# Patient Record
Sex: Male | Born: 1935 | Race: White | Hispanic: No | Marital: Married | Smoking: Former smoker
Health system: Southern US, Community
[De-identification: ages and names within clinical notes are randomized; demographics above are authoritative.]

## PROBLEM LIST (undated history)

## (undated) DIAGNOSIS — I219 Acute myocardial infarction, unspecified: Secondary | ICD-10-CM

## (undated) DIAGNOSIS — E119 Type 2 diabetes mellitus without complications: Secondary | ICD-10-CM

## (undated) DIAGNOSIS — F419 Anxiety disorder, unspecified: Secondary | ICD-10-CM

## (undated) DIAGNOSIS — F329 Major depressive disorder, single episode, unspecified: Secondary | ICD-10-CM

## (undated) DIAGNOSIS — F32A Depression, unspecified: Secondary | ICD-10-CM

## (undated) DIAGNOSIS — I10 Essential (primary) hypertension: Secondary | ICD-10-CM

## (undated) DIAGNOSIS — E785 Hyperlipidemia, unspecified: Secondary | ICD-10-CM

## (undated) DIAGNOSIS — M1990 Unspecified osteoarthritis, unspecified site: Secondary | ICD-10-CM

## (undated) DIAGNOSIS — R5383 Other fatigue: Secondary | ICD-10-CM

## (undated) DIAGNOSIS — IMO0001 Reserved for inherently not codable concepts without codable children: Secondary | ICD-10-CM

## (undated) DIAGNOSIS — D696 Thrombocytopenia, unspecified: Secondary | ICD-10-CM

## (undated) DIAGNOSIS — R42 Dizziness and giddiness: Secondary | ICD-10-CM

## (undated) DIAGNOSIS — M25569 Pain in unspecified knee: Secondary | ICD-10-CM

## (undated) DIAGNOSIS — R51 Headache: Secondary | ICD-10-CM

## (undated) DIAGNOSIS — G4730 Sleep apnea, unspecified: Secondary | ICD-10-CM

## (undated) HISTORY — DX: Thrombocytopenia, unspecified: D69.6

## (undated) HISTORY — PX: EYE SURGERY: SHX253

## (undated) HISTORY — DX: Hyperlipidemia, unspecified: E78.5

## (undated) HISTORY — PX: CARDIAC CATHETERIZATION: SHX172

## (undated) HISTORY — DX: Other fatigue: R53.83

## (undated) HISTORY — DX: Acute myocardial infarction, unspecified: I21.9

## (undated) HISTORY — DX: Pain in unspecified knee: M25.569

## (undated) HISTORY — DX: Type 2 diabetes mellitus without complications: E11.9

## (undated) HISTORY — DX: Dizziness and giddiness: R42

## (undated) HISTORY — DX: Essential (primary) hypertension: I10

---

## 2002-03-07 ENCOUNTER — Ambulatory Visit (HOSPITAL_COMMUNITY): Admission: EM | Admit: 2002-03-07 | Discharge: 2002-03-07 | Payer: Self-pay | Admitting: Emergency Medicine

## 2002-03-07 ENCOUNTER — Encounter: Payer: Self-pay | Admitting: Urology

## 2002-03-07 ENCOUNTER — Encounter: Payer: Self-pay | Admitting: Emergency Medicine

## 2002-03-16 ENCOUNTER — Encounter: Payer: Self-pay | Admitting: Urology

## 2002-03-16 ENCOUNTER — Ambulatory Visit (HOSPITAL_BASED_OUTPATIENT_CLINIC_OR_DEPARTMENT_OTHER): Admission: RE | Admit: 2002-03-16 | Discharge: 2002-03-16 | Payer: Self-pay | Admitting: Urology

## 2002-05-14 ENCOUNTER — Ambulatory Visit (HOSPITAL_COMMUNITY): Admission: RE | Admit: 2002-05-14 | Discharge: 2002-05-14 | Payer: Self-pay | Admitting: Gastroenterology

## 2002-05-14 ENCOUNTER — Encounter (INDEPENDENT_AMBULATORY_CARE_PROVIDER_SITE_OTHER): Payer: Self-pay | Admitting: *Deleted

## 2002-06-03 ENCOUNTER — Encounter: Payer: Self-pay | Admitting: Interventional Cardiology

## 2002-06-03 ENCOUNTER — Inpatient Hospital Stay (HOSPITAL_COMMUNITY): Admission: EM | Admit: 2002-06-03 | Discharge: 2002-06-09 | Payer: Self-pay | Admitting: Emergency Medicine

## 2002-06-04 ENCOUNTER — Encounter: Payer: Self-pay | Admitting: Thoracic Surgery (Cardiothoracic Vascular Surgery)

## 2002-06-05 ENCOUNTER — Encounter: Payer: Self-pay | Admitting: Thoracic Surgery (Cardiothoracic Vascular Surgery)

## 2002-06-06 ENCOUNTER — Encounter: Payer: Self-pay | Admitting: Thoracic Surgery (Cardiothoracic Vascular Surgery)

## 2002-06-07 ENCOUNTER — Encounter: Payer: Self-pay | Admitting: Surgery

## 2002-06-27 ENCOUNTER — Inpatient Hospital Stay (HOSPITAL_COMMUNITY): Admission: EM | Admit: 2002-06-27 | Discharge: 2002-07-02 | Payer: Self-pay | Admitting: Emergency Medicine

## 2003-07-03 DIAGNOSIS — I219 Acute myocardial infarction, unspecified: Secondary | ICD-10-CM

## 2003-07-03 HISTORY — PX: CORONARY ARTERY BYPASS GRAFT: SHX141

## 2003-07-03 HISTORY — DX: Acute myocardial infarction, unspecified: I21.9

## 2005-03-27 ENCOUNTER — Encounter: Admission: RE | Admit: 2005-03-27 | Discharge: 2005-03-27 | Payer: Self-pay | Admitting: Urology

## 2005-03-29 ENCOUNTER — Ambulatory Visit (HOSPITAL_COMMUNITY): Admission: RE | Admit: 2005-03-29 | Discharge: 2005-03-29 | Payer: Self-pay | Admitting: Urology

## 2005-03-29 ENCOUNTER — Ambulatory Visit (HOSPITAL_BASED_OUTPATIENT_CLINIC_OR_DEPARTMENT_OTHER): Admission: RE | Admit: 2005-03-29 | Discharge: 2005-03-29 | Payer: Self-pay | Admitting: Urology

## 2005-04-05 ENCOUNTER — Ambulatory Visit (HOSPITAL_COMMUNITY): Admission: RE | Admit: 2005-04-05 | Discharge: 2005-04-05 | Payer: Self-pay | Admitting: Urology

## 2007-02-09 ENCOUNTER — Ambulatory Visit (HOSPITAL_COMMUNITY): Admission: RE | Admit: 2007-02-09 | Discharge: 2007-02-09 | Payer: Self-pay | Admitting: Family Medicine

## 2007-02-09 ENCOUNTER — Encounter (INDEPENDENT_AMBULATORY_CARE_PROVIDER_SITE_OTHER): Payer: Self-pay | Admitting: Family Medicine

## 2007-02-27 ENCOUNTER — Inpatient Hospital Stay (HOSPITAL_COMMUNITY): Admission: EM | Admit: 2007-02-27 | Discharge: 2007-03-03 | Payer: Self-pay | Admitting: Emergency Medicine

## 2008-02-20 ENCOUNTER — Encounter: Admission: RE | Admit: 2008-02-20 | Discharge: 2008-02-20 | Payer: Self-pay | Admitting: Family Medicine

## 2008-02-20 ENCOUNTER — Encounter: Payer: Self-pay | Admitting: Pulmonary Disease

## 2008-08-30 ENCOUNTER — Encounter: Admission: RE | Admit: 2008-08-30 | Discharge: 2008-08-30 | Payer: Self-pay | Admitting: Family Medicine

## 2008-08-30 ENCOUNTER — Encounter: Payer: Self-pay | Admitting: Pulmonary Disease

## 2008-09-27 ENCOUNTER — Ambulatory Visit: Payer: Self-pay | Admitting: Pulmonary Disease

## 2008-09-27 DIAGNOSIS — E119 Type 2 diabetes mellitus without complications: Secondary | ICD-10-CM

## 2008-09-27 DIAGNOSIS — I10 Essential (primary) hypertension: Secondary | ICD-10-CM

## 2008-09-27 DIAGNOSIS — Z91041 Radiographic dye allergy status: Secondary | ICD-10-CM

## 2008-09-27 DIAGNOSIS — R519 Headache, unspecified: Secondary | ICD-10-CM | POA: Insufficient documentation

## 2008-09-27 DIAGNOSIS — J309 Allergic rhinitis, unspecified: Secondary | ICD-10-CM | POA: Insufficient documentation

## 2008-09-27 DIAGNOSIS — J984 Other disorders of lung: Secondary | ICD-10-CM | POA: Insufficient documentation

## 2008-09-27 DIAGNOSIS — I219 Acute myocardial infarction, unspecified: Secondary | ICD-10-CM | POA: Insufficient documentation

## 2008-09-27 DIAGNOSIS — E785 Hyperlipidemia, unspecified: Secondary | ICD-10-CM

## 2008-09-27 DIAGNOSIS — R51 Headache: Secondary | ICD-10-CM

## 2009-03-16 ENCOUNTER — Encounter: Payer: Self-pay | Admitting: Pulmonary Disease

## 2009-03-25 ENCOUNTER — Ambulatory Visit: Payer: Self-pay | Admitting: Internal Medicine

## 2009-09-01 ENCOUNTER — Ambulatory Visit: Payer: Self-pay | Admitting: Pulmonary Disease

## 2010-01-26 ENCOUNTER — Telehealth (INDEPENDENT_AMBULATORY_CARE_PROVIDER_SITE_OTHER): Payer: Self-pay | Admitting: *Deleted

## 2010-02-27 ENCOUNTER — Encounter: Admission: RE | Admit: 2010-02-27 | Discharge: 2010-02-27 | Payer: Self-pay | Admitting: Pulmonary Disease

## 2010-03-02 ENCOUNTER — Ambulatory Visit: Payer: Self-pay | Admitting: Pulmonary Disease

## 2010-07-26 ENCOUNTER — Ambulatory Visit: Payer: Self-pay | Admitting: Hematology & Oncology

## 2010-08-01 NOTE — Assessment & Plan Note (Signed)
Summary: rov for pulmonary nodule.   Copy to:  Henrine Screws Primary Provider/Referring Provider:  Deboraha Sprang at [facility]  CC:  Pt is here for a f/u appt to discuss CT results.   Pt states he notices sob when he "bends over and stands back up."  States it feels like a "black out."  Pt states he has a non-productive cough but will also occ cough up white sputum. [name]  History of Present Illness: The pt comes in today for f/u of his known abnormal ct chest.  He has a h/o GGO in RLL that has been followed, and his 2 year anniversary chest ct that was just done shows no change.  He denies any significant symptoms such as weight loss, chest pain, hemooptysis.  He has a minimal cough that is not overly bothersome.  Current Medications (verified): 1)  Metformin Hcl 500 Mg Tabs (Metformin Hcl) .... Take 1 Tablet By Mouth Two Times A Day 2)  Zocor 40 Mg Tabs (Simvastatin) .... Take 1 Tablet By Mouth Once A Day 3)  Carvedilol 12.5 Mg Tabs (Carvedilol) .... Take 1 Tablet By Mouth Two Times A Day 4)  Finasteride 5 Mg Tabs (Finasteride) .... Take 1 Tablet By Mouth Once A Day 5)  Sertraline Hcl 50 Mg Tabs (Sertraline Hcl) .... Take 1 Tablet By Mouth Once A Day 6)  Bayer Aspirin 325 Mg Tabs (Aspirin) .... Take 1 Tablet By Mouth Once A Day 7)  Tylenol Pm Extra Strength 500-25 Mg Tabs (Diphenhydramine-Apap (Sleep)) .... As Needed 8)  Tylenol Extra Strength 500 Mg Tabs (Acetaminophen) .... As Needed 9)  Zyrtec Allergy 10 Mg Tabs (Cetirizine Hcl) .... Take 1 Tablet By Mouth Once A Day As Needed  Allergies (verified): 1)  * Contrast Dye  Review of Systems       The patient complains of shortness of breath with activity, productive cough, non-productive cough, acid heartburn, indigestion, sore throat, headaches, nasal congestion/difficulty breathing through nose, and sneezing.  The patient denies shortness of breath at rest, coughing up blood, chest pain, irregular heartbeats, loss of appetite, weight change,  abdominal pain, difficulty swallowing, tooth/dental problems, itching, ear ache, anxiety, depression, hand/feet swelling, joint stiffness or pain, rash, change in color of mucus, and fever.    Vital Signs:  Patient profile:   77 year old male Height:      76 inches Weight:      280.25 pounds BMI:     34.24 O2 Sat:      96 % on Room air Temp:     98.3 degrees F oral Pulse rate:   66 / minute BP sitting:   120 / 70  (left arm) Cuff size:   large  Vitals Entered By: Arman Filter LPN (March 02, 2010 10:21 AM)  O2 Flow:  Room air CC: Pt is here for a f/u appt to discuss CT results.   Pt states he notices sob when he "bends over and stands back up."  States it feels like a "black out."  Pt states he has a non-productive cough but will also occ cough up white sputum.  Comments Medications reviewed with patient Arman Filter LPN  March 02, 2010 10:21 AM    Physical Exam  General:  obese male in nad Lungs:  clear to auscultation Heart:  rrr Extremities:  no significant edema noted, no cyanosis Neurologic:  alert and oriented, moves all 4.   Impression & Recommendations:  Problem # 1:  PULMONARY NODULE, RIGHT  LOWER LOBE (ICD-518.89)  the abnormal ct has been without change for 2 years.  This satisfies the criteria for the definition of benign.  However, his densities in question were ground glass in nature, and there has been some recommendations to follow these types of densities longer.  I have recommended to the pt to have a cxr in one year, or sooner if new symptoms develop.  Medications Added to Medication List This Visit: 1)  Finasteride 5 Mg Tabs (Finasteride) .... Take 1 tablet by mouth once a day 2)  Zyrtec Allergy 10 Mg Tabs (Cetirizine hcl) .... Take 1 tablet by mouth once a day as needed  Other Orders: Est. Patient Level II (16109)  Patient Instructions: 1)  your "spots" have been stable over 2 years, and therefore no further ct surveillance required.  I  would recommend a standard cxr in one year with your primary md.  Will send a note to them.

## 2010-08-01 NOTE — Assessment & Plan Note (Signed)
Summary: rov for pulmonary nodule   Primary Provider/Referring Provider:  Deboraha Sprang at [facility]  CC:  Pt is here for a 33yr f/u appt.  Pt states breathing is unchanged from last visit.  Pt states coughing up white sputum but believes this is d/t allergies. Pt denied any new complaints.  .  History of Present Illness: The pt comes in today for f/u of his abnormal ct chest.  He has a GGO in his RLL that is being followed.  His last ct was in Sept of last year, and he is due for one last f/u in sept of this year.  He has only a mild cough that primarily occurs at night on lying down, and he feels is due to postnasal drip.  His appetite is adequate, and he is not losing weight.  He denies any hemoptysis  Medications Prior to Update: 1)  Metformin Hcl 500 Mg Tabs (Metformin Hcl) .... Take 1 Tablet By Mouth Two Times A Day 2)  Zocor 40 Mg Tabs (Simvastatin) .... Take 1 Tablet By Mouth Once A Day 3)  Niaspan 1000 Mg Cr-Tabs (Niacin (Antihyperlipidemic)) .... Take 1 Tablet By Mouth Two Times A Day 4)  Carvedilol 12.5 Mg Tabs (Carvedilol) .... Take 1 Tablet By Mouth Two Times A Day 5)  Avodart 0.5 Mg Caps (Dutasteride) .... Take 1 Tablet By Mouth Once A Day 6)  Cimetidine 400 Mg Tabs (Cimetidine) .... Take 1 Tablet By Mouth Once A Day 7)  Sertraline Hcl 50 Mg Tabs (Sertraline Hcl) .... Take 1 Tablet By Mouth Once A Day 8)  Bayer Aspirin 325 Mg Tabs (Aspirin) .... Take 1 Tablet By Mouth Once A Day 9)  Tylenol Pm Extra Strength 500-25 Mg Tabs (Diphenhydramine-Apap (Sleep)) .... As Needed 10)  Tylenol Extra Strength 500 Mg Tabs (Acetaminophen) .... As Needed 11)  Zyrtec Allergy 10 Mg Tabs (Cetirizine Hcl) .... Take 1 Tablet By Mouth Once A Day  Allergies (verified): 1)  * Contrast Dye  Review of Systems      See HPI  Vital Signs:  Patient profile:   77 year old male Height:      76 inches Weight:      283 pounds BMI:     34.57 O2 Sat:      95 % on Room air Temp:     97.6 degrees F  oral Pulse rate:   70 / minute BP sitting:   120 / 70  (left arm) Cuff size:   large  Vitals Entered By: Arman Filter LPN (September 01, 452 10:11 AM)  O2 Flow:  Room air CC: Pt is here for a 63yr f/u appt.  Pt states breathing is unchanged from last visit.  Pt states coughing up white sputum but believes this is d/t allergies. Pt denied any new complaints.   Comments Medications reviewed with patient Arman Filter LPN  September 02, 979 10:11 AM    Physical Exam  General:  ow male in nad Lungs:  fairly clear to auscultation Heart:  rrr   Impression & Recommendations:  Problem # 1:  PULMONARY NODULE, RIGHT LOWER LOBE (ICD-518.89)  The pt has a ground glass opacity in RLL that is being followed, and has not changed in 18mos.  He is due for one more f/u in Sept of this year.  Other Orders: Est. Patient Level II (19147)  Patient Instructions: 1)  will schedule for last scan of chest in september of this year.  Please call us  in august to schedule.  I will also put a reminder for me in the computer.   Immunization History:  Influenza Immunization History:    Influenza:  historical (08/02/2009)

## 2010-08-01 NOTE — Progress Notes (Signed)
Summary: ct scan  Phone Note Call from Patient   Caller: Spouse nancy Call For: clance Summary of Call: pt need to sch ct scan and would like to have it at  [facility] [place] on [address].unable to come on tues. Initial call taken by: Rickard Patience,  January 26, 2010 9:16 AM  Follow-up for Phone Call        Per last OV from 08/2009, pt needs to have CT chest Sept of this year.   Spoke with pt's wife, Harriett Sine.  Per Harriett Sine, pt has OV with [facility] on Sept 1 -- requesting to have CT in August before seeing [facility].  Would also like to have the CT at [facility] in [place].    Dr. Shelle Iron, pls Francee Piccolo if this is ok.  Thanks! Follow-up by: Gweneth Dimitri RN,  January 26, 2010 9:40 AM  Additional Follow-up for Phone Call Additional follow up Details #1::        ok to have noncontrasted ct chest the end of august.  ok to have in [place] ONLY IF SHOWS UP IN [facility] in computer. Additional Follow-up by: Barbaraann Share MD,  January 26, 2010 4:59 PM    Additional Follow-up for Phone Call Additional follow up Details #2::    Order placed for ct chest non contrast to be done at [facility] in [place].   Follow-up by: Vernie Murders,  January 26, 2010 5:07 PM

## 2010-08-17 ENCOUNTER — Ambulatory Visit (HOSPITAL_BASED_OUTPATIENT_CLINIC_OR_DEPARTMENT_OTHER): Payer: Medicare Other | Admitting: Hematology & Oncology

## 2010-08-17 ENCOUNTER — Other Ambulatory Visit: Payer: Self-pay | Admitting: Hematology & Oncology

## 2010-08-17 DIAGNOSIS — D6949 Other primary thrombocytopenia: Secondary | ICD-10-CM

## 2010-08-17 DIAGNOSIS — E119 Type 2 diabetes mellitus without complications: Secondary | ICD-10-CM

## 2010-08-17 DIAGNOSIS — I10 Essential (primary) hypertension: Secondary | ICD-10-CM

## 2010-08-17 DIAGNOSIS — Z79899 Other long term (current) drug therapy: Secondary | ICD-10-CM

## 2010-08-17 LAB — CBC WITH DIFFERENTIAL (CANCER CENTER ONLY)
BASO%: 0.9 % (ref 0.0–2.0)
EOS%: 3.1 % (ref 0.0–7.0)
LYMPH%: 27.3 % (ref 14.0–48.0)
MCH: 28.5 pg (ref 28.0–33.4)
MCHC: 33.2 g/dL (ref 32.0–35.9)
MCV: 86 fL (ref 82–98)
MONO%: 9.9 % (ref 0.0–13.0)
NEUT#: 2.9 10*3/uL (ref 1.5–6.5)
Platelets: 106 10*3/uL — ABNORMAL LOW (ref 145–400)
RDW: 13 % (ref 10.5–14.6)

## 2010-08-17 LAB — MORPHOLOGY - CHCC SATELLITE

## 2010-08-21 LAB — ANA: Anti Nuclear Antibody(ANA): NEGATIVE

## 2010-08-21 LAB — LACTATE DEHYDROGENASE: LDH: 181 U/L (ref 94–250)

## 2010-08-21 LAB — PROTEIN ELECTROPHORESIS, SERUM
Alpha-1-Globulin: 4.3 % (ref 2.9–4.9)
Alpha-2-Globulin: 9 % (ref 7.1–11.8)
Beta 2: 4.6 % (ref 3.2–6.5)
Beta Globulin: 6.7 % (ref 4.7–7.2)
Gamma Globulin: 13.4 % (ref 11.1–18.8)

## 2010-11-14 NOTE — H&P (Signed)
NAME:  Jacob [name], Jacob [name]                ACCOUNT NO.:  [id number]   MEDICAL RECORD NO.:  [id number]          PATIENT TYPE:  INP   LOCATION:  1323                         FACILITY:  [facility]   PHYSICIAN:  Kela Millin, M.D.DATE OF BIRTH:  [birth year]   DATE OF ADMISSION:  02/27/2007  DATE OF DISCHARGE:                              HISTORY & PHYSICAL   PRIMARY CARE PHYSICIAN:  Sigmund Hazel, M.D.   CHIEF COMPLAINT:  Right knee area of swelling and drainage.   HISTORY OF PRESENT ILLNESS:  The patient is a 77 year old white male  with past medical history significant for recently diagnosed diabetes  mellitus, coronary artery disease with history of MI and status post  CABG x4 in 2003, hypertension, hyperlipidemia, GERD, panic disorder and  obesity who presents with above complaints.  He states that earlier in  August he was kneeling down and scrubbing the bathroom floor with his  knees padded when he felt some pain in his right knee.  He states he did  not think too much about it, but the next day it began swelling and a  few days later he went to the walk-in clinic on February 09, 2007.  He  reports that he was sent to have an ultrasound of his lower extremities  done and he was told that this was negative/within normal limits, and  that he was started on oral steroids for probable bursitis.  He reports  that there was no improvement in his symptoms with the steroids, and so  he was subsequently started on antibiotic, Keflex per his primary care  physician, which he took for seven days (August 19-25).  Even following  that, he continued to have the swelling with pain and redness in that  area.  So, he was given another course of antibiotics, doxycycline which  he had been on since August 26.  On the day prior to admission, the  patient reports that he began having a drainage from those areas on his  leg and so he went to see his primary care physician and was directly  admitted for further  evaluation and management.  The patient denies any  insect bites.  He also denies fevers, dysuria, cough, diarrhea, melena  and no hematochezia.   PAST MEDICAL HISTORY:  1. As stated above.  2. History of ureteral calculus and status post stents.  3. GERD.   MEDICATIONS:  1. Metformin 500 mg p.o. b.i.d.  2. Zocor.  3. Zoloft 50 mg daily.  4. Niacin.  5. Simethicone 400 mg daily.  6. Avodart 0.5 mg daily.  7. Carvedilol 6.25 mg p.o. b.i.d.   ALLERGIES:  IVP DYE, IODINE CONTRAST.   SOCIAL HISTORY:  He quit tobacco 30-40 years ago, occasional alcohol.   FAMILY HISTORY:  Noncontributory.   REVIEW OF SYSTEMS:  As per HPI, other review of systems negative .   PHYSICAL EXAMINATION:  GENERAL:  The patient is an elderly white male,  in no apparent distress.  VITAL SIGNS:  Temperature 97.2, blood pressure 121/73, pulse 86,  respirations 18, O2 saturation 96%.  HEENT:  PERRL.  EOMI.  Sclerae anicteric.  Mucous membranes moist.  LUNGS:  Clear to auscultation bilaterally.  No crackles or wheezes.  CARDIOVASCULAR:  Regular rate and rhythm.  Normal S1, S2.  ABDOMEN:  Soft, bowel sounds present.  Nontender, nondistended.  No  organomegaly.  No masses palpable.  EXTREMITIES:  He has erythema around his right knee and it is edematous  (prepatellar).  Just inferiorly and medially to the knee joint, there is  a fluctuant area, and also more laterally, there is an area of  developing fluctuance as well.  He has serosanguineous dried up drainage  from the inferomedial area.  NEUROLOGICAL:  He is alert and oriented x3.  Cranial nerves II-XII  grossly intact.  Nonfocal exam.   LABORATORY DATA:  White cell count 5.5, hemoglobin 12.9, hematocrit  37.8, platelets 113,000, 60% neutrophils.  Sodium 139, potassium 4.3,  chloride 108, CO2 26, glucose 117, BUN 17, creatinine 1.02.  His AST is  43 with an ALT of 58, alk-phos 66, total bilirubin is 0.8.  His EKG  shows normal sinus rhythm, no ischemic  changes.   ASSESSMENT/PLAN:  1. Right leg/knee cellulitis with probable abscess - will obtain      cultures.  Also, MRI of the knee.  Empiric antibiotics.  Follow in      consult of Orthopedics accordingly for further recommendations      pending above studies.  2. Diabetes mellitus - obtain a hemoglobin A1C, tight blood glucose      control, sliding scale coverage and continue outpatient medications      follow.  3. Coronary artery disease - continue Carvedilol, patient chest pain      free, EKG as above.  4. GERD - place on PPI and follow.  5. Hyperlipidemia.  Continue outpatient medications.      Kela Millin, M.D.  Electronically Signed     ACV/MEDQ  D:  02/28/2007  T:  02/28/2007  Job:  [id number]   cc:   Sigmund Hazel, M.D.  Fax: [phone]

## 2010-11-14 NOTE — Discharge Summary (Signed)
[name], [name]                ACCOUNT NO.:  [id number]   MEDICAL RECORD NO.:  [id number]          PATIENT TYPE:  INP   LOCATION:  1323                         FACILITY:  [facility]   PHYSICIAN:  Kela Millin, M.D.DATE OF BIRTH:  08/04/1932   DATE OF ADMISSION:  02/27/2007  DATE OF DISCHARGE:  03/03/2007                               DISCHARGE SUMMARY   DISCHARGE DIAGNOSES:  1. Methicillin-sensitive Staphylococcus aureus bursa infection, right      knee, with cellulitis - status post incision and drainage  per      orthopedics.  2. Diabetes mellitus.  3. Coronary artery disease.  4. Hypertension.  5. Gastroesophageal reflux disease.  6. Hyperlipidemia.  7. History of panic disorder.  8. History of ureteral calculus - status post stents.   PROCEDURES AND STUDIES:  1. MRI of the right knee - cellulitis and prepatellar bursitis.  The      patient's prepatellar fluid may be infected.  The fluid collection      extends inferiorly into the pretibial space, with the collection      overall measuring approximately 10 cm.  Studies negative for      osteomyelitis.  Cellulitis and pretibial/prepatellar bursitis.  2. I&D of abscess performed per orthopedics on 03/01/2007.   CONSULTATIONS:  Orthopedics, Dr. Luiz [name].   HISTORY OF PRESENT ILLNESS:  The patient is a 77 year old white male  with the above-listed medical problems who presented with swelling and a  drainage from his right knee area.  He stated that this began earlier in  01/2007 after he had scrubbed the floor while kneeling down.  He was  treated outpatient with steroids initially, and then with antibiotics -  initially Keflex and then with doxycycline just prior to his admission.  Because the swelling was increasing and he began having some drainage,  he was admitted for further evaluation and management.  Please see the  full admission history and physical of 02/27/2007 for the admission  physical exam, as well as  laboratory data.   HOSPITAL COURSE:  1. Methicillin-sensitive Staphylococcus aureus bursa infection, right      knee, with cellulitis in leg - upon admission the patient had wound      cultures done, as well as blood cultures, and was empirically      placed on broad-spectrum IV antibiotics, including coverage for      MRSA.  The wound cultures grew Staph aureus that was oxacillin-      sensitive.  The blood cultures have remained negative to date.  The      patient had an MRI of his right knee done upon admission, and      orthopedics was consulted as well, and Dr. Luiz [name] saw the patient      and I&D of the infected bursa was done.  The fluid was sent to the      lab and Gram stain was done, which showed no organisms.  Following      the culture results the vancomycin was discontinued, and the      patient has remained afebrile and his  symptoms have continued to      improve.  Orthopedics saw him on rounds today and recommended that      he be discharged on oral antibiotics for two more weeks.  He will      be discharged on Augmentin, and he is to follow up with Dr. Luiz [name]      on Thursday.  Dressing changes as instructed per orthopedics.  The      patient is also to follow up with his primary care physician.  2. Coronary artery disease  - stable.  The patient remained chest pain-      free during his hospital stay.  He was maintained on his outpatient      medications.  3. Hypertension - the patient was maintained on his outpatient      medications during his hospital stay.  4. History of panic disorder - patient was maintained on his      outpatient medications.  5. GERD - the patient was maintained on the PPI while in the hospital.  6. Diabetes mellitus - he is to continue his metformin upon discharge.   DISCHARGE MEDICATIONS:  1. Augmentin 875 mg one p.o. b.i.d. x2 more weeks.  2. The patient is to continue his preadmission medications -      metformin, Zocor, Zoloft, Niacin,  simethicone, Avodart, carvedilol.   FOLLOW-UP CARE:  1. Dr. Luiz [name] on Thursday as scheduled.  2. Dr. Sigmund [name] in one to two weeks.   CONDITION ON DISCHARGE:  Improved/stable.      Kela Millin, M.D.  Electronically Signed     ACV/MEDQ  D:  03/03/2007  T:  03/03/2007  Job:  [id number]   cc:   Jacob [name], M.D.  Fax: [phone]   Jacob [name], M.D.  Fax: [phone]

## 2010-11-17 NOTE — Op Note (Signed)
TNAMEWEI, POPLASKI                         ACCOUNT NO.:  [id number]   MEDICAL RECORD NO.:  [id number]                   PATIENT TYPE:  EMS   LOCATION:  ED                                   FACILITY[facility]  [facility]   PHYSICIAN:  Claudette Laws, M.D.               DATE OF BIRTH:  05/21/1933   DATE OF PROCEDURE:  03/07/2002  DATE OF DISCHARGE:                                 OPERATIVE REPORT   PREOPERATIVE DIAGNOSES:  1. Proximal obstructing right ureteral calculus.  2. Renal colic and hydronephrosis.  3. Past history of nephrolithiasis.   POSTOPERATIVE DIAGNOSE:  1. Proximal obstructing right ureteral calculus.  2. Renal colic and hydronephrosis.  3. Past history of nephrolithiasis.   OPERATION:  1. Cystoscopy.  2. Right retrograde pyeloureterogram.  3. Insertion of a 6 French 26 cm double-J stent.   SURGEON:  Claudette Laws, M.D.   HISTORY:  This is a 77 year old man, who presented in the [facility] ER  earlier this morning with right-sided colicky pain.  Subsequent CAT scan  showed a 6-7 mm obstructing proximal right ureteral calculus with high-grade  obstruction.  The patient has been having symptoms now for several days.  We  discussed treatment options, and I thought for now the best treatment would  be to insert a double-J stent.  The procedure was carefully explained to he  and his wife.  They understand and agrees to the proposed surgery.   DESCRIPTION OF PROCEDURE:  The patient was prepped and draped in the dorsal  lithotomy position under intubated general anesthesia.  Cystoscopy revealed  a normal anterior urethra, a small prostate, some elevation of the posterior  lip, normal bladder, otherwise no tumors, no calculi, normal ureteral  orifices.   Initially, I intubated the ureter with a 6 [place] open-ended ureteral  catheter over an .038 Glidewire.  Retrograde pyelogram studies were  performed.  Pictures were taken.  We the positioned the guidewire in the  upper  pole calix, then put in a #6 French 26 cm double-J stent.  It was  positioned into the renal pelvis.  The distal end was curled up in the  bladder.  The bladder was emptied, and all instruments were removed.   He was then given a B&O suppository per rectum for bladder spasms and also  Xylocaine jelly per urethra 10 cc for anesthetic purposes.   He was then taken back to the recovery room in satisfactory condition.   The plan now is to follow up with [facility] on a timely basis.                                               Claudette Laws, M.D.    RFS/MEDQ  D:  03/07/2002  T:  03/07/2002  Job:  [id number]

## 2010-11-17 NOTE — Cardiovascular Report (Signed)
NAME:  Jacob [name], Jacob [name]                          ACCOUNT NO.:  [id number]   MEDICAL RECORD NO.:  [id number]                   PATIENT TYPE:  INP   LOCATION:  1825                                 FACILITY:  [facility]   PHYSICIAN:  Lesleigh Noe, M.D.            DATE OF BIRTH:  [birth year]   DATE OF PROCEDURE:  06/03/2002  DATE OF DISCHARGE:                              CARDIAC CATHETERIZATION   INDICATIONS FOR PROCEDURE:  Acute inferior myocardial infarction.   PROCEDURE PERFORMED:  1. Left heart catheterization.  2. Selective coronary angiogram.  3. Left ventriculography.  4. Venous sheath insertion.  5. Percutaneous transluminal coronary angioplasty of the posterior     descending artery of the right coronary.   DESCRIPTION OF PROCEDURE:  After informed consent, the patient was brought  from the [facility] Emergency Room where he was diagnosed as having an acute  inferior myocardial infarction.  The patient feels that his symptoms started  at 12:10 p.m. today. In Dr. Fredirick Maudlin office ST segment was noted in the  inferior leads.  In the emergency room this electrocardiographic change  persisted.  He was started on IV heparin being given a 5000 unit IV bolus.  Reportedly, he received 300 mg of Plavix orally by EMS en route, started on  IV nitroglycerin drip, given aspirin, and also morphine for pain.   In the catheterization lab, a 7 French arterial sheath and a 6 French venous  sheath was placed in the right femoral artery and vein respectively.  Coronary angiography was performed with a 6 French A2 multipurpose catheter  to measure hemodynamics, to perform ventriculography by hand injection, and  to perform right coronary angiography.  We then performed left coronary  angiography using a 6 French #4 left Judkins catheter.  The patient had  extensive left coronary disease and a subtotally occluded proximal PDA that  was felt to be the culprit vessel.  We upgraded to a 7 [place]  right Judkins  catheter using a 300 cm long BMW wire, a double bolus followed by infusion  of Integrilin was started and angioplasty to the PA was performed using a  2.5 mm 15 mm long and a 3.0 mm 15 mm long Maverick balloon for  recanalization.  ACT prior to wire intervention was noted to be greater than  300 seconds.  The patient tolerated the procedure without complications. I  decided against stenting the PDA as this could potentially prevent access  for coronary grafting, as the patient's left coronary anatomy seems to  dictate the bypass surgery be considered as a treatment of choice for long-  term survival and control of symptoms.  ACT postprocedure was 336 seconds.   RESULTS:   HEMODYNAMIC DATA:  1. Aortic pressure 103/68.  2. Left ventricular pressure 120/14.   LEFT VENTRICULOGRAPHY:  Inferoapical severe hypokinesis is noted. EF is  greater than 50%.  No MR.   SELECTIVE  CORONARY ANGIOGRAPHY:  1. Left main coronary artery:  The left main is large and trifurcates into     LAD, ramus and circumflex.  No significant left main disease is noted.  2. Left anterior descending coronary:  The left anterior descending is     diffusely diseased with 40-50% proximal and ostial narrowing.  A large     septal perforator arises from the proximal LAD and trifurcates into the     intraventricular septum. The LAD beyond the septal perforator contains an     85% segmental stenosis.  There is ratty disease throughout the LAD beyond     this stenosis. The first diagonal arises just distal to the septal     perforator from the LAD and in its midportion before a bifurcation in the     diagonal there is 60-70% diffuse narrowing.  3. Ramus branch:  The ramus branch is large arising from the left main.     There is ostial 70-80% narrowing.  There is proximal 80-90% ramus branch     narrowing.  The ramus bifurcates and is relatively large.  The proximal     and mid vessel are tortuous.  4.  Circumflex artery:  Circumflex coronary artery bifurcates into two obtuse     marginals.  The first obtuse marginal is large, the second obtuse     marginal is relatively small.  The ostium of the first obtuse marginal     contains an 80% stenosis.  There is 50-70% mid circumflex stenosis.  5. Right coronary:  The right coronary is dominant and it gives origin to a     large PDA which is totally occluded in its proximal segment and also to a     trifurcating left ventricular branch.  There are luminal irregularities     noted throughout the proximal right coronary and mid right coronary but     no high-grade obstruction is felt to be present.   PERCUTANEOUS INTERVENTION:  Percutaneous transluminal coronary angioplasty  of the PDA resulted in relief of  99% obstruction to less than 30% with TIMI  grade 3 flow noted postprocedure.   CONCLUSIONS:  1. Acute inferior infarction due to occlusion of the posterior descending     coronary artery.  2. Successful percutaneous transluminal coronary angioplasty on the     posterior descending artery.  3. High-grade obstruction in the proximal ramus via mid circumflex in the     proximal and mid left anterior descending.  4. Mild left ventricular dysfunction with inferoapical wall motion     abnormality.   PLAN:  Continue aspirin and Integrilin.  Hold Plavix.  Consider for coronary  revascularization early next week assuming no recurrent acute ischemia.  If  recurrent acute ischemia, will need to stent the PDA.                                               Lesleigh Noe, M.D.    HWS/MEDQ  D:  06/03/2002  T:  06/03/2002  Job:  [id number]   cc:   Caryn Bee L. Little, M.D.  [address]  [place]  [place] [address]  Fax: [phone]   CVTS

## 2010-11-17 NOTE — Discharge Summary (Signed)
NAME:  Jacob [name], Jacob [name]                          ACCOUNT NO.:  [id number]   MEDICAL RECORD NO.:  [id number]                   PATIENT TYPE:  INP   LOCATION:  2009                                 FACILITY:  [facility]   PHYSICIAN:  Salvatore Decent. Dorris Fetch, M.D.         DATE OF BIRTH:  29-Apr-1933   DATE OF ADMISSION:  06/03/2002  DATE OF DISCHARGE:  06/09/2002                                 DISCHARGE SUMMARY   CARDIOLOGIST:  Lyn Records, M.D.   PRIMARY CARE PHYSICIAN:  Al Decant. Janey Greaser, M.D.   DISCHARGE DIAGNOSES:  1. Severe three-vessel coronary artery disease.  2. Ejection fraction of 50%.  3. Status post acute inferior myocardial infarction.  4. Hypertension.  5. Dyslipidemia.  6. Diet-controlled hyperglycemia.  7. Obesity.  8. Panic disorder.  9. Gastroesophageal reflux disease.  10.      Postoperative anemia, transfused.  11.      Postoperative volume excess.   PROCEDURE:  1. Cardiac catheterization on June 03, 2002.  2. Coronary artery bypass graft surgery x4 on June 04, 2002, with the     following grafts:  LIMA to the LAD, saphenous vein graft to the first     diagonal, saphenous vein graft to the ramus intermedius, saphenous vein     graft to the posterior descending.   HISTORY OF PRESENT ILLNESS:  The patient is a 77 year old gentleman who  presented with an acute evolving myocardial infarction.  He had been at the  gym exercising, when he developed chest pain.  He was brought to the  hospital and was ruled in for a myocardial infarction.   HOSPITAL COURSE:  He had an emergent cardiac catheterization done, and was  stabilized.  CVTS was consulted and a coronary artery bypass graft surgery  was recommended.  The patient agreed to proceed.  On the following day on  June 04, 2002, the patient underwent the procedure with no complications.  Postoperatively he did well with routine care.  He was extubated on  postoperative day one.  He was doing well and was  deemed suitable for  transfer to unit 2000.  He transferred to 2000 on June 05, 2002.  He did  require a transfusion for postoperative anemia.  This improved over a couple  of days.  He needed a little bit of extra diuresis for volume excess.  On  June 08, 2002, on postoperative day four he is doing well on 2000.  He is  ambulating well.  He is diuresing.  He is still about 13 pounds overweight.  He is in sinus rhythm.  His wounds are healing well.  He still has some  lower extremity edema.  It is anticipated that he will be suitable for  discharge pending satisfactory morning rounds on postoperative day five, on  June 09, 2002.   MEDICATIONS:  1. Lopressor 25 mg p.o. b.i.d.  2. Lasix 40 mg p.o. b.i.d. for five  days, then one tab q.d.  3. Kay-Ciel 20 mEq p.o. b.i.d. for five days, then one tablet q.d.  4. Niferex 150 mg one tab  q.d.  5. Colace 100 mg two tab q.d.  6. Enteric-coated aspirin 325 mg q.d.  7. He will resume his home Zoloft.  8. He will resume his home Tagamet.  9. He will resume his home Clarinex.  10.      He is given Ultram 50 mg one to two q.4-6h. p.r.n. pain.   ALLERGIES:  IVP DYE.   SPECIAL INSTRUCTIONS:  He is told to avoid driving, heavy lifting, strenuous  activity, working.  He is told to walk daily and to use his incentive  spirometer daily.  He is told that he may shower and to clean his wounds  with soap and water.  To call the office if he has any wound concerns.  He  is told to get a chest x-ray when he sees the cardiologist in two weeks, and  to bring it with him to see Dr. Viviann Spare C. Hendrickson.   CONDITION ON DISCHARGE:  Stable.   DISPOSITION:  Home.   FOLLOW UP:  1. With Dr. Verdis Prime on Wednesday, June 10, 2002, at 9:15 a.m. for a     chest x-ray, and at 10 a.m. for the appointment.  2. With Dr.  Dorris Fetch on Wednesday, July 01, 2002, at 9:45 a.m.       Lissa Merlin, P.A.                          Salvatore Decent Dorris Fetch,  M.D.    Alwyn Ren  D:  06/08/2002  T:  06/08/2002  Job:  [id number]   cc:   Lesleigh Noe, M.D.  [address]  [address]  [place]  [place] [address]  Fax: [phone]   Doran Clay, M.D.

## 2010-11-17 NOTE — Op Note (Signed)
NAME:  Jacob [name], Jacob [name]                ACCOUNT NO.:  [id number]   MEDICAL RECORD NO.:  [id number]          PATIENT TYPE:  AMB   LOCATION:  [facility]                         FACILITY:  [facility]   PHYSICIAN:  Claudette Laws, M.D.  DATE OF BIRTH:  Mar 18, 1933   DATE OF PROCEDURE:  03/29/2005  DATE OF DISCHARGE:                                 OPERATIVE REPORT   PREOPERATIVE DIAGNOSIS:  Left ureteral calculus with renal colic.   POSTOPERATIVE DIAGNOSIS:  Left ureteral calculus with renal colic.   OPERATION:  Cystoscopy and insertion of a 6-French 26 cm double-J stent.   SURGEON:  Claudette Laws, M.D.   DESCRIPTION OF PROCEDURE:  The patient was prepped and draped in the dorsal  lithotomy position under LMA anesthesia.  Cystoscopy was performed with a 22-  French rigid cystoscope and a normal anterior urethra.  Some early BPH with  elevation of the posterior lip.  The bladder itself was grossly normal.  No  obvious tumors.  Trabeculation +1.   Using a 6-French open-ended ureteral catheter, I passed up to a 0.038  Glidewire under fluoroscopic control.  The Glidewire was positioned in the  left kidney.  We then backed out the open-ended catheter and then passed up  a 6-French 26 cm double-J stent without a string, again using fluoroscopic  control.  The distal end was curled up in the bladder. The bladder was  emptied.  All instruments were removed.  A B&O  suppository was then placed  for anesthetic purposes.   The patient was then taken back to the recovery room in satisfactory  condition.      Claudette Laws, M.D.  Electronically Signed     RFS/MEDQ  D:  03/29/2005  T:  03/29/2005  Job:  [id number]

## 2010-11-17 NOTE — Op Note (Signed)
NAME:  Jacob [name], Jacob [name]                          ACCOUNT NO.:  [id number]   MEDICAL RECORD NO.:  [id number]                   PATIENT TYPE:  INP   LOCATION:  2305                                 FACILITY:  [facility]   PHYSICIAN:  Salvatore Decent. Dorris Fetch, M.D.         DATE OF BIRTH:  Oct 09, 1932   DATE OF PROCEDURE:  06/04/2002  DATE OF DISCHARGE:                                 OPERATIVE REPORT   PREOPERATIVE DIAGNOSIS:  Severe three vessel disease status post acute  myocardial infarction.   POSTOPERATIVE DIAGNOSIS:  Severe three vessel disease status post acute  myocardial infarction.   PROCEDURE:  Median sternotomy, extracorporeal circulation, coronary artery  bypass grafting x 4 (left internal mammary artery to left anterior  descending , saphenous vein graft to first diagonal, saphenous vein graft to  ramus intermedius, saphenous vein graft to posterior descending), endoscopic  vein harvest from right thigh.   SURGEON:  Salvatore Decent. Dorris Fetch, M.D.   ASSISTANT:  Kerin Perna, M.D.   SECOND ASSISTANT:  Levin Erp. Steward, P.A.   ANESTHESIA:  General.   FINDINGS:  Saphenous vein large caliber, fair quality.  Mammary artery good  quality.  Coronaries heavily calcified proximally and diffusely diseased.  Good quality at site of anastomoses.  OM too small to graft.   CLINICAL NOTE:  Jacob [name] is a 77 year old gentleman who presented with an  acute evolving myocardial infarction.  He underwent emergent cardiac  catheterization and percutaneous angioplasty of his posterior descending  which was totally occluded by Dr. Verdis Prime.  He was found to have severe  three vessel coronary disease and was referred for coronary artery bypass  grafting.  The indications, risks, benefits, and alternative procedures were  discussed in detail with the patient.  He understood and accepted the risks  including the increased risk of bleeding given the preoperative Plavix and  Integrilin and agreed to  proceed with surgery.   OPERATIVE NOTE:  Jacob [name] was brought to the preop holding area on June 04, 2002.  Lines were placed to monitor arterial, central venous and  pulmonary arterial pressure.  Intravenous antibiotics were administered.  The patient was taken to the operating room, anesthetized, and intubated.  A  Foley catheter was placed.  The chest, abdomen, and legs were prepped and  draped in the usual fashion.  A median sternotomy was performed and the left  internal mammary artery was harvested using standard technique.  The mammary  artery was of good quality.  The patient was fully heparinized prior to  dividing the distal end of the mammary artery, there was excellent flow  through the cut end of the vessel.  Simultaneously while harvesting the  mammary artery, the greater saphenous vein was localized with an incision at  the level of the knee.  It was harvested from the thigh using endoscopic  technique.  An additional segment was harvested from the upper calf using  open technique.  After inspecting the vein, there was a segment in the mid  portion which was unusable due to varicosities.  It was a large caliber vein  throughout.  An additional segment was harvested from the lower leg on the  right using standard open technique.   The pericardium was opened, the ascending aorta was inspected and palpated,  there was no palpable atherosclerotic disease.  The aorta was cannulated via  concentric 2-0 Ethibond pledgeted purse-string sutures.  A dual stage venous  cannula was placed through a purse-string suture in the right atrial  appendage.  Cardiopulmonary bypass was instituted and the patient was cooled  to 32 degrees Celsius.  The coronary arteries were inspected and anastomotic  sites were chosen.  Of note, the OM branch was too small to graft.  The  remaining vessels were all heavily calcified proximally but all were of good  quality at the site of the anastomoses.   The diagonal vessel was very  diffusely diseased.  The other vessels had some mildly diffuse disease in  them, as well.  The conduits were inspected and cut to length.  A foam pad  was placed in the pericardium to protect the left phrenic nerve.  A  cardioplegic cannula was placed in the ascending aorta.  The aorta was  crossclamped, the left ventricle was emptied via the aortic root vent,  cardiopulmonary bypass was achieved with a combination of cold antegrade  cardioplegia and topical iced saline.   After achieving a complete diastolic arrest and myocardial surface  temperature of 13 degrees Celsius, the following distal anastomoses were  performed:  First, reversed saphenous vein graft was placed end-to-side to  the first diagonal branch of the LAD, this was a 1.5 mm diffusely diseased  vessel although it was good quality at the site of the anastomosis and 1.5  mm probe could be passed distally in the vessel.  The anastomosis was  performed with a running 7-0 Prolene suture.  The vein graft was of fair  quality, was of large caliber and thick walled.  There was excellent flow  through the graft, cardioplegia was administered, and there was good  hemostasis at the anastomosis.   Next, a reversed saphenous vein graft was placed end-to-side to the  posterior descending, the posterior descending was a 1.5 mm vessel, there  was significant plaque proximally and there was blood staining in the  epicardium at the site of the previous PTCA the previous day.  The PDA was  of good quality at the site of the anastomosis and 1.5 mm probe did pass  easily distally.  The anastomosis was performed with a running 7-0 Prolene  suture in an end-to-side fashion.  Again, there was excellent flow through  the graft, cardioplegia was administered, and there was good hemostasis at  the anastomosis.  Next, a reversed saphenous vein graft was placed end-to-side to the ramus  intermedius branch, the ramus  intermedius was a 2 mm good quality target, it  was superficially intramyocardial at the site where the anastomosis was  performed.  The vein graft was of good quality.  The anastomosis was  performed with a running 7-0 Prolene.  There was excellent flow through the  graft, cardioplegia was administered, and there was good hemostasis at the  anastomosis.   Next, the left internal mammary artery was brought through a window in the  pericardium, the distal end was spatulated, it was a 2 mm good quality  conduit.  It was anastomosed end-to-side to the distal LAD which was a 1.5  mm good quality target.  The anastomosis was performed end-to-side with a  running 8-0 Prolene suture.  At the completion of the mammary to the LAD  anastomosis the clamp was removed from the mammary artery to inspect for  hemostasis.  Septal rewarming was noted, the bulldog clamp was replaced.  The mammary pedicle was tacked to the epicardial surface of the heart with 6-  0 Prolene sutures.   Additional cardioplegia was administered down the vein grafts, they were cut  to length.  The proximal vein graft anastomoses were performed to 4.4 mm  punch aortotomies with running 6-0 Prolene sutures.  This was done while  under crossclamp.  At the completion of the final proximal anastomosis, the  patient was placed in Trendelenburg position.  The bulldog clamp was removed  from the mammary artery, immediate rewarming was noted.  Lidocaine was  administered.  The aortic root was deaired.  The patient was placed in  Trendelenburg position and the aortic Cross clamp was removed, total  crossclamp time was 78 minutes.  The vein grafts were deaired, the bulldog  clamps were removed from them, as well.  All proximal and distal anastomoses  were inspected for hemostasis.  Epicardial pacing wires were placed on the  right ventricle and right atrium.   When the patient had been rewarmed to a core temperature of 37 degrees   Celsius, he was weaned from cardiopulmonary bypass.  The patient weaned from  bypass without difficulty.  He was in sinus rhythm and on no inotropic  support at the time of separation from bypass.  Total bypass time was 137  minutes.  The initial cardiac index was greater than 2 liters per minute per  meter squared and the patient remained hemodynamically stable throughout  post bypass.  Protamine was administered and was well tolerated.  The atrial  and aortic cannulae were removed, the remainder of the protamine was  administered without incident.  The chest was irrigated with 1 liter of warm  normal saline and 1 gram vancomycin.  Hemostasis was achieved.  The  pericardium was reapproximated with interrupted 3-0 silk sutures.  This came  together easily without tension.  Left pleural and two mediastinal chest  tubes were placed through separate subcostal incisions.  The sternum was closed with heavy gauge stainless steel wires.  The pectoralis fascia was  closed with running #1 Vicryl suture.  The subcutaneous tissue and skin was  closed in standard fashion and both the chest and the leg skin closures.  All sponge, instrument, and needle counts were correct at the end of the  procedure.  The patient remained hemodynamically stable and was taken from  the operating room to the surgical intensive care unit.                                               Salvatore Decent Dorris Fetch, M.D.    SCH/MEDQ  D:  06/04/2002  T:  06/04/2002  Job:  [id number]   cc:   Lesleigh Noe, M.D.  [address]  [address]  [place]  [place] [address]  Fax: [phone]   Al Decant. Janey Greaser, M.D.  [address]  [place]  [place] [address]  Fax: [phone]

## 2010-11-17 NOTE — Discharge Summary (Signed)
NAME:  Jacob [name], Jacob [name]                          ACCOUNT NO.:  [id number]   MEDICAL RECORD NO.:  [id number]                   PATIENT TYPE:  INP   LOCATION:  2006                                 FACILITY:  [facility]   PHYSICIAN:  Salvatore Decent. Dorris Fetch, M.D.         DATE OF BIRTH:  1933-03-05   DATE OF ADMISSION:  DATE OF DISCHARGE:  07/02/2002                                 DISCHARGE SUMMARY   FINAL DIAGNOSIS:  Right wound infection, Serratia on culture.   SECONDARY DIAGNOSES:  1. Coronary artery disease.  2. Hypertension.  3. Gastroesophageal reflux disease.  4. Panic disorder.  5. Hyperlipidemia.   PROCEDURES:  Venous duplex Doppler on June 29, 2002, was negative for  DVT in the lower extremities.   HISTORY OF PRESENT ILLNESS:  The patient is a 77 year old male who recently  underwent CABG x 4 on June 04, 2002. He did well with no major  complications and was discharged to home on postoperative day #5, June 09, 2002. He was later found to have swelling and redness in his right lower  extremity around the saphenous vein harvest site. He was seen by Dr. Katrinka Blazing  with cardiology. He was started on p.o. antibiotics. His leg, however,  continued to remain red and swollen. He had DVTs done at the office which  were negative for DVT. He also had some fevers and some drainage from the  wound.   HOSPITAL COURSE:  He was ultimately admitted to the hospital to Dr.  Dorris Fetch on June 27, 2002, for IV antibiotics and wound care.  Cultures were taken which grew out Serratia and he was covered appropriately  with antibiotics. His wound has improved with local  wound care and packing  of the wound twice a day. He is afebrile and doing quite well. It is  anticipated he should be suitable for discharge pending satisfactory morning  rounds on July 01, 2002.   MEDICATIONS:  He will resume his previous home medicines. These included:  1. Lopressor 25 mg p.o. b.i.d.  2. Lasix  40 mg q.d.  3. KCl 20 mEq q.d.  4. Enteric coated aspirin 325 mg q.d.  5. Niferex 150, 1 q.d.  6. Colace 100 mg 2 q.d.  7. Tagamet 400 mg 1 q.d.  8. Zoloft 50 mg 1 q.d.  9. Clarinex 5 mg p.r.n.  10.      Ultram 50 mg 1 to 2 q.4-6h. p.r.n. pain.  11.      He was also given a prescription for Cipro 500 mg q.12h. for 13     more days to complete a 14 day course.   DISCHARGE INSTRUCTIONS:  He was told to avoid heavy lifting, strenuous  activity, driving and working. He was told that he could shower and to walk  daily. He was told a home nurse will come out to change his dressing at  least once a day, and his  family member will be instructed on changing it  once a day also.   CONDITION ON DISCHARGE:  Improved.   DISPOSITION:  Discharged to home.   FOLLOW UP:  The CVTS office Dr. Dorris Fetch will see him on July 16, 2002, at 12 p.m.     Lissa Merlin, P.A.                          Salvatore Decent Dorris Fetch, M.D.    Alwyn Ren  D:  07/01/2002  T:  07/02/2002  Job:  [id number]

## 2011-04-13 LAB — DIFFERENTIAL
Basophils Relative: 1
Eosinophils Absolute: 0.1
Lymphs Abs: 1.4
Neutrophils Relative %: 60

## 2011-04-13 LAB — GRAM STAIN: Gram Stain: NONE SEEN

## 2011-04-13 LAB — COMPREHENSIVE METABOLIC PANEL
ALT: 58 — ABNORMAL HIGH
Alkaline Phosphatase: 66
CO2: 26
Calcium: 8.6
GFR calc non Af Amer: >60
Glucose, Bld: 117 — ABNORMAL HIGH
Sodium: 139

## 2011-04-13 LAB — CBC
HCT: 37.8 — ABNORMAL LOW
Hemoglobin: 12.9 — ABNORMAL LOW
MCHC: 34.1
MCHC: 34.8
RBC: 4.34
RDW: 13.7

## 2011-04-13 LAB — BASIC METABOLIC PANEL
Calcium: 8.4
GFR calc Af Amer: >60
GFR calc non Af Amer: >60
Glucose, Bld: 156 — ABNORMAL HIGH
Sodium: 143

## 2011-04-13 LAB — WOUND CULTURE: Gram Stain: NONE SEEN

## 2011-04-13 LAB — HEMOGLOBIN A1C: Mean Plasma Glucose: 204

## 2011-04-13 LAB — CULTURE, BLOOD (ROUTINE X 2)

## 2011-07-23 DIAGNOSIS — Z79899 Other long term (current) drug therapy: Secondary | ICD-10-CM | POA: Diagnosis not present

## 2011-07-23 DIAGNOSIS — N401 Enlarged prostate with lower urinary tract symptoms: Secondary | ICD-10-CM | POA: Diagnosis not present

## 2011-07-23 DIAGNOSIS — I10 Essential (primary) hypertension: Secondary | ICD-10-CM | POA: Diagnosis not present

## 2011-07-25 DIAGNOSIS — N401 Enlarged prostate with lower urinary tract symptoms: Secondary | ICD-10-CM | POA: Diagnosis not present

## 2011-09-10 DIAGNOSIS — E1139 Type 2 diabetes mellitus with other diabetic ophthalmic complication: Secondary | ICD-10-CM | POA: Diagnosis not present

## 2011-11-06 DIAGNOSIS — R9389 Abnormal findings on diagnostic imaging of other specified body structures: Secondary | ICD-10-CM | POA: Diagnosis not present

## 2011-11-06 DIAGNOSIS — G4730 Sleep apnea, unspecified: Secondary | ICD-10-CM | POA: Diagnosis not present

## 2011-11-06 DIAGNOSIS — I10 Essential (primary) hypertension: Secondary | ICD-10-CM | POA: Diagnosis not present

## 2011-11-06 DIAGNOSIS — E119 Type 2 diabetes mellitus without complications: Secondary | ICD-10-CM | POA: Diagnosis not present

## 2011-11-08 ENCOUNTER — Other Ambulatory Visit: Payer: Self-pay | Admitting: Family Medicine

## 2011-11-08 DIAGNOSIS — R9389 Abnormal findings on diagnostic imaging of other specified body structures: Secondary | ICD-10-CM

## 2011-11-14 DIAGNOSIS — IMO0002 Reserved for concepts with insufficient information to code with codable children: Secondary | ICD-10-CM | POA: Diagnosis not present

## 2011-11-14 DIAGNOSIS — M999 Biomechanical lesion, unspecified: Secondary | ICD-10-CM | POA: Diagnosis not present

## 2011-11-14 DIAGNOSIS — M9981 Other biomechanical lesions of cervical region: Secondary | ICD-10-CM | POA: Diagnosis not present

## 2011-11-15 ENCOUNTER — Ambulatory Visit
Admission: RE | Admit: 2011-11-15 | Discharge: 2011-11-15 | Disposition: A | Discharge status: Home / Self Care | Payer: Medicare Other | Source: Ambulatory Visit | Attending: Family Medicine | Admitting: Family Medicine

## 2011-11-15 DIAGNOSIS — M999 Biomechanical lesion, unspecified: Secondary | ICD-10-CM | POA: Diagnosis not present

## 2011-11-15 DIAGNOSIS — M9981 Other biomechanical lesions of cervical region: Secondary | ICD-10-CM | POA: Diagnosis not present

## 2011-11-15 DIAGNOSIS — IMO0002 Reserved for concepts with insufficient information to code with codable children: Secondary | ICD-10-CM | POA: Diagnosis not present

## 2011-11-15 DIAGNOSIS — R9389 Abnormal findings on diagnostic imaging of other specified body structures: Secondary | ICD-10-CM

## 2011-11-15 DIAGNOSIS — R918 Other nonspecific abnormal finding of lung field: Secondary | ICD-10-CM | POA: Diagnosis not present

## 2011-11-19 DIAGNOSIS — IMO0002 Reserved for concepts with insufficient information to code with codable children: Secondary | ICD-10-CM | POA: Diagnosis not present

## 2011-11-19 DIAGNOSIS — M999 Biomechanical lesion, unspecified: Secondary | ICD-10-CM | POA: Diagnosis not present

## 2011-11-19 DIAGNOSIS — M9981 Other biomechanical lesions of cervical region: Secondary | ICD-10-CM | POA: Diagnosis not present

## 2011-11-21 DIAGNOSIS — M9981 Other biomechanical lesions of cervical region: Secondary | ICD-10-CM | POA: Diagnosis not present

## 2011-11-21 DIAGNOSIS — IMO0002 Reserved for concepts with insufficient information to code with codable children: Secondary | ICD-10-CM | POA: Diagnosis not present

## 2011-11-21 DIAGNOSIS — M999 Biomechanical lesion, unspecified: Secondary | ICD-10-CM | POA: Diagnosis not present

## 2011-11-22 DIAGNOSIS — M9981 Other biomechanical lesions of cervical region: Secondary | ICD-10-CM | POA: Diagnosis not present

## 2011-11-22 DIAGNOSIS — M999 Biomechanical lesion, unspecified: Secondary | ICD-10-CM | POA: Diagnosis not present

## 2011-11-22 DIAGNOSIS — IMO0002 Reserved for concepts with insufficient information to code with codable children: Secondary | ICD-10-CM | POA: Diagnosis not present

## 2011-11-27 DIAGNOSIS — M999 Biomechanical lesion, unspecified: Secondary | ICD-10-CM | POA: Diagnosis not present

## 2011-11-27 DIAGNOSIS — IMO0002 Reserved for concepts with insufficient information to code with codable children: Secondary | ICD-10-CM | POA: Diagnosis not present

## 2011-11-27 DIAGNOSIS — M9981 Other biomechanical lesions of cervical region: Secondary | ICD-10-CM | POA: Diagnosis not present

## 2011-11-28 DIAGNOSIS — IMO0002 Reserved for concepts with insufficient information to code with codable children: Secondary | ICD-10-CM | POA: Diagnosis not present

## 2011-11-28 DIAGNOSIS — M999 Biomechanical lesion, unspecified: Secondary | ICD-10-CM | POA: Diagnosis not present

## 2011-11-28 DIAGNOSIS — M9981 Other biomechanical lesions of cervical region: Secondary | ICD-10-CM | POA: Diagnosis not present

## 2011-11-29 DIAGNOSIS — IMO0002 Reserved for concepts with insufficient information to code with codable children: Secondary | ICD-10-CM | POA: Diagnosis not present

## 2011-11-29 DIAGNOSIS — M9981 Other biomechanical lesions of cervical region: Secondary | ICD-10-CM | POA: Diagnosis not present

## 2011-11-29 DIAGNOSIS — M999 Biomechanical lesion, unspecified: Secondary | ICD-10-CM | POA: Diagnosis not present

## 2011-12-03 DIAGNOSIS — M9981 Other biomechanical lesions of cervical region: Secondary | ICD-10-CM | POA: Diagnosis not present

## 2011-12-03 DIAGNOSIS — M999 Biomechanical lesion, unspecified: Secondary | ICD-10-CM | POA: Diagnosis not present

## 2011-12-03 DIAGNOSIS — IMO0002 Reserved for concepts with insufficient information to code with codable children: Secondary | ICD-10-CM | POA: Diagnosis not present

## 2011-12-04 DIAGNOSIS — IMO0002 Reserved for concepts with insufficient information to code with codable children: Secondary | ICD-10-CM | POA: Diagnosis not present

## 2011-12-04 DIAGNOSIS — M999 Biomechanical lesion, unspecified: Secondary | ICD-10-CM | POA: Diagnosis not present

## 2011-12-04 DIAGNOSIS — M9981 Other biomechanical lesions of cervical region: Secondary | ICD-10-CM | POA: Diagnosis not present

## 2011-12-06 DIAGNOSIS — IMO0002 Reserved for concepts with insufficient information to code with codable children: Secondary | ICD-10-CM | POA: Diagnosis not present

## 2011-12-06 DIAGNOSIS — M999 Biomechanical lesion, unspecified: Secondary | ICD-10-CM | POA: Diagnosis not present

## 2011-12-06 DIAGNOSIS — M9981 Other biomechanical lesions of cervical region: Secondary | ICD-10-CM | POA: Diagnosis not present

## 2011-12-10 DIAGNOSIS — M9981 Other biomechanical lesions of cervical region: Secondary | ICD-10-CM | POA: Diagnosis not present

## 2011-12-10 DIAGNOSIS — IMO0002 Reserved for concepts with insufficient information to code with codable children: Secondary | ICD-10-CM | POA: Diagnosis not present

## 2011-12-10 DIAGNOSIS — M999 Biomechanical lesion, unspecified: Secondary | ICD-10-CM | POA: Diagnosis not present

## 2011-12-13 DIAGNOSIS — M9981 Other biomechanical lesions of cervical region: Secondary | ICD-10-CM | POA: Diagnosis not present

## 2011-12-13 DIAGNOSIS — M999 Biomechanical lesion, unspecified: Secondary | ICD-10-CM | POA: Diagnosis not present

## 2011-12-13 DIAGNOSIS — IMO0002 Reserved for concepts with insufficient information to code with codable children: Secondary | ICD-10-CM | POA: Diagnosis not present

## 2011-12-17 DIAGNOSIS — IMO0002 Reserved for concepts with insufficient information to code with codable children: Secondary | ICD-10-CM | POA: Diagnosis not present

## 2011-12-17 DIAGNOSIS — M999 Biomechanical lesion, unspecified: Secondary | ICD-10-CM | POA: Diagnosis not present

## 2011-12-17 DIAGNOSIS — M9981 Other biomechanical lesions of cervical region: Secondary | ICD-10-CM | POA: Diagnosis not present

## 2011-12-19 DIAGNOSIS — IMO0002 Reserved for concepts with insufficient information to code with codable children: Secondary | ICD-10-CM | POA: Diagnosis not present

## 2011-12-19 DIAGNOSIS — M999 Biomechanical lesion, unspecified: Secondary | ICD-10-CM | POA: Diagnosis not present

## 2011-12-19 DIAGNOSIS — M9981 Other biomechanical lesions of cervical region: Secondary | ICD-10-CM | POA: Diagnosis not present

## 2011-12-20 DIAGNOSIS — G4733 Obstructive sleep apnea (adult) (pediatric): Secondary | ICD-10-CM | POA: Diagnosis not present

## 2012-01-08 DIAGNOSIS — M999 Biomechanical lesion, unspecified: Secondary | ICD-10-CM | POA: Diagnosis not present

## 2012-01-08 DIAGNOSIS — M9981 Other biomechanical lesions of cervical region: Secondary | ICD-10-CM | POA: Diagnosis not present

## 2012-01-08 DIAGNOSIS — IMO0002 Reserved for concepts with insufficient information to code with codable children: Secondary | ICD-10-CM | POA: Diagnosis not present

## 2012-01-10 DIAGNOSIS — M9981 Other biomechanical lesions of cervical region: Secondary | ICD-10-CM | POA: Diagnosis not present

## 2012-01-10 DIAGNOSIS — IMO0002 Reserved for concepts with insufficient information to code with codable children: Secondary | ICD-10-CM | POA: Diagnosis not present

## 2012-01-10 DIAGNOSIS — M999 Biomechanical lesion, unspecified: Secondary | ICD-10-CM | POA: Diagnosis not present

## 2012-01-14 DIAGNOSIS — M9981 Other biomechanical lesions of cervical region: Secondary | ICD-10-CM | POA: Diagnosis not present

## 2012-01-14 DIAGNOSIS — M999 Biomechanical lesion, unspecified: Secondary | ICD-10-CM | POA: Diagnosis not present

## 2012-01-14 DIAGNOSIS — IMO0002 Reserved for concepts with insufficient information to code with codable children: Secondary | ICD-10-CM | POA: Diagnosis not present

## 2012-01-17 DIAGNOSIS — G4733 Obstructive sleep apnea (adult) (pediatric): Secondary | ICD-10-CM | POA: Diagnosis not present

## 2012-01-17 DIAGNOSIS — IMO0002 Reserved for concepts with insufficient information to code with codable children: Secondary | ICD-10-CM | POA: Diagnosis not present

## 2012-01-17 DIAGNOSIS — M999 Biomechanical lesion, unspecified: Secondary | ICD-10-CM | POA: Diagnosis not present

## 2012-01-17 DIAGNOSIS — M9981 Other biomechanical lesions of cervical region: Secondary | ICD-10-CM | POA: Diagnosis not present

## 2012-01-23 DIAGNOSIS — N401 Enlarged prostate with lower urinary tract symptoms: Secondary | ICD-10-CM | POA: Diagnosis not present

## 2012-01-24 DIAGNOSIS — IMO0002 Reserved for concepts with insufficient information to code with codable children: Secondary | ICD-10-CM | POA: Diagnosis not present

## 2012-01-24 DIAGNOSIS — M9981 Other biomechanical lesions of cervical region: Secondary | ICD-10-CM | POA: Diagnosis not present

## 2012-01-24 DIAGNOSIS — M999 Biomechanical lesion, unspecified: Secondary | ICD-10-CM | POA: Diagnosis not present

## 2012-01-30 DIAGNOSIS — M9981 Other biomechanical lesions of cervical region: Secondary | ICD-10-CM | POA: Diagnosis not present

## 2012-01-30 DIAGNOSIS — M999 Biomechanical lesion, unspecified: Secondary | ICD-10-CM | POA: Diagnosis not present

## 2012-01-30 DIAGNOSIS — IMO0002 Reserved for concepts with insufficient information to code with codable children: Secondary | ICD-10-CM | POA: Diagnosis not present

## 2012-02-07 DIAGNOSIS — M999 Biomechanical lesion, unspecified: Secondary | ICD-10-CM | POA: Diagnosis not present

## 2012-02-07 DIAGNOSIS — M9981 Other biomechanical lesions of cervical region: Secondary | ICD-10-CM | POA: Diagnosis not present

## 2012-02-07 DIAGNOSIS — IMO0002 Reserved for concepts with insufficient information to code with codable children: Secondary | ICD-10-CM | POA: Diagnosis not present

## 2012-02-14 DIAGNOSIS — M9981 Other biomechanical lesions of cervical region: Secondary | ICD-10-CM | POA: Diagnosis not present

## 2012-02-14 DIAGNOSIS — IMO0002 Reserved for concepts with insufficient information to code with codable children: Secondary | ICD-10-CM | POA: Diagnosis not present

## 2012-02-14 DIAGNOSIS — M999 Biomechanical lesion, unspecified: Secondary | ICD-10-CM | POA: Diagnosis not present

## 2012-02-19 DIAGNOSIS — L570 Actinic keratosis: Secondary | ICD-10-CM | POA: Diagnosis not present

## 2012-02-19 DIAGNOSIS — L259 Unspecified contact dermatitis, unspecified cause: Secondary | ICD-10-CM | POA: Diagnosis not present

## 2012-02-19 DIAGNOSIS — Z85828 Personal history of other malignant neoplasm of skin: Secondary | ICD-10-CM | POA: Diagnosis not present

## 2012-02-20 DIAGNOSIS — I10 Essential (primary) hypertension: Secondary | ICD-10-CM | POA: Diagnosis not present

## 2012-02-20 DIAGNOSIS — Z79899 Other long term (current) drug therapy: Secondary | ICD-10-CM | POA: Diagnosis not present

## 2012-02-20 DIAGNOSIS — E119 Type 2 diabetes mellitus without complications: Secondary | ICD-10-CM | POA: Diagnosis not present

## 2012-02-20 DIAGNOSIS — E782 Mixed hyperlipidemia: Secondary | ICD-10-CM | POA: Diagnosis not present

## 2012-02-28 DIAGNOSIS — IMO0002 Reserved for concepts with insufficient information to code with codable children: Secondary | ICD-10-CM | POA: Diagnosis not present

## 2012-02-28 DIAGNOSIS — M9981 Other biomechanical lesions of cervical region: Secondary | ICD-10-CM | POA: Diagnosis not present

## 2012-02-28 DIAGNOSIS — M999 Biomechanical lesion, unspecified: Secondary | ICD-10-CM | POA: Diagnosis not present

## 2012-03-12 DIAGNOSIS — G4733 Obstructive sleep apnea (adult) (pediatric): Secondary | ICD-10-CM | POA: Diagnosis not present

## 2012-03-13 DIAGNOSIS — M999 Biomechanical lesion, unspecified: Secondary | ICD-10-CM | POA: Diagnosis not present

## 2012-03-13 DIAGNOSIS — M9981 Other biomechanical lesions of cervical region: Secondary | ICD-10-CM | POA: Diagnosis not present

## 2012-03-13 DIAGNOSIS — IMO0002 Reserved for concepts with insufficient information to code with codable children: Secondary | ICD-10-CM | POA: Diagnosis not present

## 2012-04-01 DIAGNOSIS — Z23 Encounter for immunization: Secondary | ICD-10-CM | POA: Diagnosis not present

## 2012-04-03 DIAGNOSIS — M999 Biomechanical lesion, unspecified: Secondary | ICD-10-CM | POA: Diagnosis not present

## 2012-04-03 DIAGNOSIS — M9981 Other biomechanical lesions of cervical region: Secondary | ICD-10-CM | POA: Diagnosis not present

## 2012-04-03 DIAGNOSIS — IMO0002 Reserved for concepts with insufficient information to code with codable children: Secondary | ICD-10-CM | POA: Diagnosis not present

## 2012-04-23 DIAGNOSIS — M9981 Other biomechanical lesions of cervical region: Secondary | ICD-10-CM | POA: Diagnosis not present

## 2012-04-23 DIAGNOSIS — M999 Biomechanical lesion, unspecified: Secondary | ICD-10-CM | POA: Diagnosis not present

## 2012-04-23 DIAGNOSIS — IMO0002 Reserved for concepts with insufficient information to code with codable children: Secondary | ICD-10-CM | POA: Diagnosis not present

## 2012-05-05 ENCOUNTER — Other Ambulatory Visit (HOSPITAL_BASED_OUTPATIENT_CLINIC_OR_DEPARTMENT_OTHER): Payer: Self-pay | Admitting: Family Medicine

## 2012-05-05 DIAGNOSIS — R109 Unspecified abdominal pain: Secondary | ICD-10-CM | POA: Diagnosis not present

## 2012-05-05 DIAGNOSIS — Z79899 Other long term (current) drug therapy: Secondary | ICD-10-CM | POA: Diagnosis not present

## 2012-05-07 DIAGNOSIS — G4730 Sleep apnea, unspecified: Secondary | ICD-10-CM | POA: Diagnosis not present

## 2012-05-07 DIAGNOSIS — I10 Essential (primary) hypertension: Secondary | ICD-10-CM | POA: Diagnosis not present

## 2012-05-07 DIAGNOSIS — R9389 Abnormal findings on diagnostic imaging of other specified body structures: Secondary | ICD-10-CM | POA: Diagnosis not present

## 2012-05-07 DIAGNOSIS — E119 Type 2 diabetes mellitus without complications: Secondary | ICD-10-CM | POA: Diagnosis not present

## 2012-05-07 DIAGNOSIS — R109 Unspecified abdominal pain: Secondary | ICD-10-CM | POA: Diagnosis not present

## 2012-05-07 DIAGNOSIS — Z79899 Other long term (current) drug therapy: Secondary | ICD-10-CM | POA: Diagnosis not present

## 2012-05-08 ENCOUNTER — Ambulatory Visit (HOSPITAL_BASED_OUTPATIENT_CLINIC_OR_DEPARTMENT_OTHER)
Admission: RE | Admit: 2012-05-08 | Discharge: 2012-05-08 | Disposition: A | Discharge status: Home / Self Care | Payer: Medicare Other | Source: Ambulatory Visit | Attending: Family Medicine | Admitting: Family Medicine

## 2012-05-08 DIAGNOSIS — N200 Calculus of kidney: Secondary | ICD-10-CM | POA: Diagnosis not present

## 2012-05-08 DIAGNOSIS — R109 Unspecified abdominal pain: Secondary | ICD-10-CM | POA: Insufficient documentation

## 2012-05-12 ENCOUNTER — Other Ambulatory Visit (HOSPITAL_BASED_OUTPATIENT_CLINIC_OR_DEPARTMENT_OTHER): Payer: Medicare Other

## 2012-05-12 DIAGNOSIS — G4730 Sleep apnea, unspecified: Secondary | ICD-10-CM | POA: Diagnosis not present

## 2012-05-12 DIAGNOSIS — I2510 Atherosclerotic heart disease of native coronary artery without angina pectoris: Secondary | ICD-10-CM | POA: Diagnosis not present

## 2012-05-12 DIAGNOSIS — E785 Hyperlipidemia, unspecified: Secondary | ICD-10-CM | POA: Diagnosis not present

## 2012-05-12 DIAGNOSIS — R011 Cardiac murmur, unspecified: Secondary | ICD-10-CM | POA: Diagnosis not present

## 2012-05-12 DIAGNOSIS — I10 Essential (primary) hypertension: Secondary | ICD-10-CM | POA: Diagnosis not present

## 2012-05-26 ENCOUNTER — Ambulatory Visit: Payer: Medicare Other | Attending: Family Medicine | Admitting: Physical Therapy

## 2012-05-26 DIAGNOSIS — IMO0001 Reserved for inherently not codable concepts without codable children: Secondary | ICD-10-CM | POA: Diagnosis not present

## 2012-05-26 DIAGNOSIS — M25559 Pain in unspecified hip: Secondary | ICD-10-CM | POA: Insufficient documentation

## 2012-05-26 DIAGNOSIS — M25659 Stiffness of unspecified hip, not elsewhere classified: Secondary | ICD-10-CM | POA: Insufficient documentation

## 2012-06-02 ENCOUNTER — Ambulatory Visit: Payer: Medicare Other | Attending: Family Medicine | Admitting: Physical Therapy

## 2012-06-02 DIAGNOSIS — IMO0001 Reserved for inherently not codable concepts without codable children: Secondary | ICD-10-CM | POA: Diagnosis not present

## 2012-06-02 DIAGNOSIS — M25659 Stiffness of unspecified hip, not elsewhere classified: Secondary | ICD-10-CM | POA: Insufficient documentation

## 2012-06-02 DIAGNOSIS — M25559 Pain in unspecified hip: Secondary | ICD-10-CM | POA: Insufficient documentation

## 2012-06-04 ENCOUNTER — Ambulatory Visit: Payer: Medicare Other | Admitting: Physical Therapy

## 2012-06-04 DIAGNOSIS — G4733 Obstructive sleep apnea (adult) (pediatric): Secondary | ICD-10-CM | POA: Diagnosis not present

## 2012-06-09 ENCOUNTER — Encounter: Payer: Medicare Other | Admitting: Physical Therapy

## 2012-06-09 DIAGNOSIS — R109 Unspecified abdominal pain: Secondary | ICD-10-CM | POA: Diagnosis not present

## 2012-06-12 ENCOUNTER — Encounter: Payer: Medicare Other | Admitting: Physical Therapy

## 2012-06-19 DIAGNOSIS — R109 Unspecified abdominal pain: Secondary | ICD-10-CM | POA: Diagnosis not present

## 2012-06-19 DIAGNOSIS — K4090 Unilateral inguinal hernia, without obstruction or gangrene, not specified as recurrent: Secondary | ICD-10-CM | POA: Diagnosis not present

## 2012-07-03 ENCOUNTER — Ambulatory Visit (INDEPENDENT_AMBULATORY_CARE_PROVIDER_SITE_OTHER): Payer: Medicare Other | Admitting: General Surgery

## 2012-07-03 ENCOUNTER — Encounter (INDEPENDENT_AMBULATORY_CARE_PROVIDER_SITE_OTHER): Payer: Self-pay | Admitting: General Surgery

## 2012-07-03 VITALS — BP 118/70 | HR 72 | Temp 98.1°F | Resp 18 | Ht 76.0 in | Wt 262.6 lb

## 2012-07-03 DIAGNOSIS — K4090 Unilateral inguinal hernia, without obstruction or gangrene, not specified as recurrent: Secondary | ICD-10-CM

## 2012-07-03 NOTE — Patient Instructions (Signed)
Hernia Repair with Laparoscope A hernia occurs when an internal organ pushes out through a weak spot in the belly (abdominal) wall muscles. Hernias most commonly occur in the groin and around the navel. Hernias can also occur through a cut by the surgeon (incision) after an abdominal operation. A hernia may be caused by:  Lifting heavy objects.  Prolonged coughing.  Straining to move your bowels. Hernias can often be pushed back into place (reduced). Most hernias tend to get worse over time. Problems occur when abdominal contents get stuck in the opening and the blood supply is blocked or impaired (incarcerated hernia). Because of these risks, you require surgery to repair the hernia. Your hernia will be repaired using a laparoscope. Laparoscopic surgery is a type of minimally invasive surgery. It does not involve making a typical surgical cut (incision) in the skin. A laparoscope is a telescope-like rod and lens system. It is usually connected to a video camera and a light source so your caregiver can clearly see the operative area. The instruments are inserted through  to  inch (5 mm or 10 mm) openings in the skin at specific locations. A working and viewing space is created by blowing a small amount of carbon dioxide gas into the abdominal cavity. The abdomen is essentially blown up like a balloon (insufflated). This elevates the abdominal wall above the internal organs like a dome. The carbon dioxide gas is common to the human body and can be absorbed by tissue and removed by the respiratory system. Once the repair is completed, the small incisions will be closed with either stitches (sutures) or staples (just like a paper stapler only this staple holds the skin together). LET YOUR CAREGIVERS KNOW ABOUT:  Allergies.  Medications taken including herbs, eye drops, over the counter medications, and creams.  Use of steroids (by mouth or creams).  Previous problems with anesthetics or  Novocaine.  Possibility of pregnancy, if this applies.  History of blood clots (thrombophlebitis).  History of bleeding or blood problems.  Previous surgery.  Other health problems. BEFORE THE PROCEDURE  Laparoscopy can be done either in a hospital or out-patient clinic. You may be given a mild sedative to help you relax before the procedure. Once in the operating room, you will be given a general anesthesia to make you sleep (unless you and your caregiver choose a different anesthetic).  AFTER THE PROCEDURE  After the procedure you will be watched in a recovery area. Depending on what type of hernia was repaired, you might be admitted to the hospital or you might go home the same day. With this procedure you may have less pain and scarring. This usually results in a quicker recovery and less risk of infection. HOME CARE INSTRUCTIONS   Bed rest is not required. You may continue your normal activities but avoid heavy lifting (more than 10 pounds) or straining.  Cough gently. If you are a smoker it is best to stop, as even the best hernia repair can break down with the continual strain of coughing.  Avoid driving until given the OK by your surgeon.  There are no dietary restrictions unless given otherwise.  TAKE ALL MEDICATIONS AS DIRECTED.  Only take over-the-counter or prescription medicines for pain, discomfort, or fever as directed by your caregiver. SEEK MEDICAL CARE IF:   There is increasing abdominal pain or pain in your incisions.  There is more bleeding from incisions, other than minimal spotting.  You feel light headed or faint.  You   develop an unexplained fever, chills, and/or an oral temperature above 102 F (38.9 C).  You have redness, swelling, or increasing pain in the wound.  Pus coming from wound.  A foul smell coming from the wound or dressings. SEEK IMMEDIATE MEDICAL CARE IF:   You develop a rash.  You have difficulty breathing.  You have any  allergic problems. MAKE SURE YOU:   Understand these instructions.  Will watch your condition.  Will get help right away if you are not doing well or get worse. Document Released: 06/18/2005 Document Revised: 09/10/2011 Document Reviewed: 05/18/2009 Exit[employer] Patient Information 2013 [employer].  

## 2012-07-03 NOTE — Progress Notes (Signed)
Patient ID: Jacob [name], male   DOB: 1932/12/05, 79 y.o.   MRN: [id number]  Chief Complaint  Patient presents with  . Inguinal Hernia    new pt- eval Right ing hernia    HPI Jacob [name] is a 79 y.o. male.   HPI 79yo WM referred by Jetta Lout, NP at [facility] for evaluation of a right inguinal hernia. The patient states that his symptoms began about 3 months ago with right groin pain. He describes his pain as a hot poker between his legs. He states that it felt like a severe burning sensation in his groin. He initially thought it was due to inguinal strain. He went to his primary care physician after about one month symptoms. A CT scan was ordered which was unrevealing. He states that he was sent to a rehabilitation center which did not give him any relief. He end up seeing his urologist because of ongoing discomfort. They felt that he had a right inguinal hernia and he was referred here. He states that the burning stinging sensation has essentially gone. He will only have it on occasion. He definitely feels a lump that was not there several months ago. The lump is more noticeable toward the end of the day or if he has been active. He denies any nausea, vomiting, diarrhea or constipation. He does have a history of BPH and takes medication for it on a daily basis. He endorses a weak stream as well as nocturia. He denies any dysuria.  Dr. Katrinka Blazing at [facility] is his cardiologist. Past Medical History  Diagnosis Date  . Diabetes mellitus without complication   . Hyperlipidemia   . Hypertension   . Myocardial infarction 2005    Past Surgical History  Procedure Date  . Coronary artery bypass graft 2005    triple bypass    Family History  Problem Relation Age of Onset  . Cancer Sister     lung and breast  . Cancer Brother     lung    Social History History  Substance Use Topics  . Smoking status: Former Games developer  . Smokeless tobacco: Not on file  . Alcohol Use: Yes     Comment:  occasional    Allergies  Allergen Reactions  . Ivp Dye (Iodinated Diagnostic Agents)     Current Outpatient Prescriptions  Medication Sig Dispense Refill  . acetaminophen (TYLENOL) 650 MG suppository Place 650 mg rectally every 4 (four) hours as needed.      [name] alfuzosin (UROXATRAL) 10 MG 24 hr tablet daily.      [name] amLODipine (NORVASC) 2.5 MG tablet Take 2.5 mg by mouth daily.      [name] aspirin 325 MG tablet Take 325 mg by mouth daily.      . carvedilol (COREG) 25 MG tablet BID times 48H.      [name] ibuprofen (ADVIL,MOTRIN) 100 MG tablet Take 100 mg by mouth every 6 (six) hours as needed.      [name] ketotifen (ZADITOR) 0.025 % ophthalmic solution 1 drop 2 (two) times daily.      [name] losartan (COZAAR) 100 MG tablet BID times 48H.      . meloxicam (MOBIC) 15 MG tablet Take 15 mg by mouth daily.      . metFORMIN (GLUCOPHAGE) 500 MG tablet Take 500 mg by mouth 2 (two) times daily with a meal.      . NITROSTAT 0.4 MG SL tablet Ad lib.      . simvastatin (ZOCOR) 40 MG  tablet Take 40 mg by mouth every evening.        Review of Systems Review of Systems  Constitutional: Negative for fever, chills, appetite change and unexpected weight change.  HENT: Negative for congestion and trouble swallowing.   Eyes: Negative for visual disturbance.  Respiratory: Positive for cough. Negative for chest tightness and shortness of breath.        Uses CPAP  Cardiovascular: Negative for chest pain and leg swelling.       No PND, no orthopnea, no DOE  Gastrointestinal:       See HPI  Genitourinary: Negative for dysuria and hematuria.       +nocturia, weak stream; nonobstructing kidney stones  Musculoskeletal: Negative.   Skin: Negative for rash.  Neurological: Negative for seizures and speech difficulty.  Hematological: Does not bruise/bleed easily.  Psychiatric/Behavioral: Negative for behavioral problems and confusion.    Blood pressure 118/70, pulse 72, temperature 98.1 F (36.7 C), temperature source  Temporal, resp. rate 18, height 6\' 4"  (1.93 m), weight 262 lb 9.6 oz (119.115 kg).  Physical Exam Physical Exam  Vitals reviewed. Constitutional: He is oriented to person, place, and time. He appears well-developed and well-nourished. No distress.       obese  HENT:  Head: Normocephalic and atraumatic.  Right Ear: External ear normal.  Left Ear: External ear normal.  Eyes: Conjunctivae normal are normal.  Neck: Normal range of motion. Neck supple. No tracheal deviation present. No thyromegaly present.  Cardiovascular: Normal rate, regular rhythm and normal heart sounds.   Pulmonary/Chest: Breath sounds normal. No respiratory distress. He has no wheezes. He exhibits no tenderness.  Abdominal: Soft. Bowel sounds are normal. He exhibits no distension. There is no tenderness. There is no rebound. Hernia confirmed negative in the right inguinal area and confirmed negative in the left inguinal area.       Small umbilical hernia - 0.5cm; reducible, nontender  Genitourinary:    Right testis shows tenderness. Right testis shows no mass. Left testis shows tenderness. Left testis shows no mass. Uncircumcised.       Reducible hernia - feels c/w direct hernia  Musculoskeletal: He exhibits no edema and no tenderness.  Lymphadenopathy:    He has no cervical adenopathy.  Neurological: He is alert and oriented to person, place, and time.  Skin: Skin is warm and dry. No rash noted. He is not diaphoretic. No erythema.  Psychiatric: He has a normal mood and affect. His behavior is normal. Thought content normal.    Data Reviewed [facility] Jetta Lout note from 06/19/12 CT abd/pevlis from 05/2012 - kidney stones- nonobstructing; right groin appears to have inguinal hernia Assessment    Right inguinal hernia    Plan    We discussed the etiology of inguinal hernias. We discussed the signs & symptoms of incarceration & strangulation.  We discussed non-operative and operative management. We  discussed both open and laparoscopic repairs. We discussed the pros and cons of each approach.  The patient has elected to proceed with a laparoscopic repair of right inguinal hernia with mesh    I described the procedure in detail.  The patient was given educational material. We discussed the risks and benefits including but not limited to bleeding, infection, chronic inguinal pain, nerve entrapment, hernia recurrence, mesh complications, hematoma formation, urinary retention, injury to the testicles or the ovaries, numbness in the groin, blood clots, injury to the surrounding structures, and anesthesia risk. We also discussed the typical post operative recovery course, including  no heavy lifting for 4-6 weeks. I explained that the likelihood of improvement of their symptoms is good.I explained that he is at higher risk for urinary retention because of his BPH issues.  He would like to wait a week or 2 until his cold has completely resolved which I completely agree with.  [name]. Andrey Campanile, MD, FACS General, Bariatric, & Minimally Invasive Surgery [facility], [place]         [facility] M 07/03/2012, 2:52 PM

## 2012-07-04 ENCOUNTER — Encounter (HOSPITAL_COMMUNITY): Payer: Self-pay | Admitting: Pharmacy Technician

## 2012-07-14 ENCOUNTER — Encounter (HOSPITAL_COMMUNITY): Payer: Self-pay

## 2012-07-14 ENCOUNTER — Encounter (HOSPITAL_COMMUNITY)
Admission: RE | Admit: 2012-07-14 | Discharge: 2012-07-14 | Disposition: A | Discharge status: Home / Self Care | Payer: Medicare Other | Source: Ambulatory Visit | Attending: General Surgery | Admitting: General Surgery

## 2012-07-14 DIAGNOSIS — I10 Essential (primary) hypertension: Secondary | ICD-10-CM | POA: Diagnosis not present

## 2012-07-14 DIAGNOSIS — Z01818 Encounter for other preprocedural examination: Secondary | ICD-10-CM | POA: Diagnosis not present

## 2012-07-14 DIAGNOSIS — E119 Type 2 diabetes mellitus without complications: Secondary | ICD-10-CM | POA: Diagnosis not present

## 2012-07-14 DIAGNOSIS — R05 Cough: Secondary | ICD-10-CM | POA: Diagnosis not present

## 2012-07-14 DIAGNOSIS — K4090 Unilateral inguinal hernia, without obstruction or gangrene, not specified as recurrent: Secondary | ICD-10-CM | POA: Diagnosis not present

## 2012-07-14 DIAGNOSIS — Z01812 Encounter for preprocedural laboratory examination: Secondary | ICD-10-CM | POA: Diagnosis not present

## 2012-07-14 HISTORY — DX: Depression, unspecified: F32.A

## 2012-07-14 HISTORY — DX: Unspecified osteoarthritis, unspecified site: M19.90

## 2012-07-14 HISTORY — DX: Reserved for inherently not codable concepts without codable children: IMO0001

## 2012-07-14 HISTORY — DX: Sleep apnea, unspecified: G47.30

## 2012-07-14 HISTORY — DX: Anxiety disorder, unspecified: F41.9

## 2012-07-14 HISTORY — DX: Headache: R51

## 2012-07-14 HISTORY — DX: Major depressive disorder, single episode, unspecified: F32.9

## 2012-07-14 LAB — CBC WITH DIFFERENTIAL/PLATELET
Eosinophils Absolute: 0.1 10*3/uL (ref 0.0–0.7)
Eosinophils Relative: 1 % (ref 0–5)
HCT: 39 % (ref 39.0–52.0)
Lymphocytes Relative: 22 % (ref 12–46)
Lymphs Abs: 1.8 10*3/uL (ref 0.7–4.0)
MCH: 28.2 pg (ref 26.0–34.0)
MCV: 83.3 fL (ref 78.0–100.0)
Monocytes Absolute: 0.8 10*3/uL (ref 0.1–1.0)
Platelets: 130 10*3/uL — ABNORMAL LOW (ref 150–400)
RBC: 4.68 MIL/uL (ref 4.22–5.81)

## 2012-07-14 LAB — BASIC METABOLIC PANEL
CO2: 28 mEq/L (ref 19–32)
Calcium: 9.9 mg/dL (ref 8.4–10.5)
Chloride: 103 mEq/L (ref 96–112)
Creatinine, Ser: 1.08 mg/dL (ref 0.50–1.35)
Glucose, Bld: 100 mg/dL — ABNORMAL HIGH (ref 70–99)
Sodium: 141 mEq/L (ref 135–145)

## 2012-07-14 MED ORDER — CEFAZOLIN SODIUM-DEXTROSE 2-3 GM-% IV SOLR
2.0000 g | INTRAVENOUS | Status: AC
  Administered 2012-07-15: 2 g via INTRAVENOUS
  Filled 2012-07-14: qty 50

## 2012-07-14 NOTE — Progress Notes (Signed)
Rec'd records from [facility]. At 1703.  Call to Dr. Randa Evens, reviewed content & pt. history, will evaluate on arrival.

## 2012-07-14 NOTE — Progress Notes (Signed)
Pt. Reports that he is fiollowed by Dr. Mendel Ryder & Dr. Kirtland Bouchard. Little with [facility].  Call to [facility]- cardiac for records, left voicemail with [facility]- to send EKG, OV note & last stress test.

## 2012-07-14 NOTE — Pre-Procedure Instructions (Signed)
Jacob [name]  07/14/2012   Your procedure is scheduled on:  07/15/12- TUESDAY  Report to [facility] at 10:15 AM.  Call this number if you have problems the morning of surgery: [phone]   Remember:   Do not eat food or drink liquids after midnight.- TONIGHT   Take these medicines the morning of surgery with A SIP OF WATER: COREG (carvedilol), Sertraline   Do not wear jewelry  Do not wear lotions, powders, or perfumes. You may wear deodorant.  . Men may shave face and neck.  Do not bring valuables to the hospital.  Contacts, dentures or bridgework may not be worn into surgery.  Leave suitcase in the car. After surgery it may be brought to your room.  For patients admitted to the hospital, checkout time is 11:00 AM the day of  discharge.   Patients discharged the day of surgery will not be allowed to drive  home.  Name and phone number of your driver: with family  Special Instructions: Shower using CHG 2 nights before surgery and the night before surgery.  If you shower the day of surgery use CHG.  Use special wash - you have one bottle of CHG for all showers.  You should use approximately 1/3 of the bottle for each shower.   Please read over the following fact sheets that you were given: Pain Booklet, Coughing and Deep Breathing, MRSA Information and Surgical Site Infection Prevention

## 2012-07-14 NOTE — Progress Notes (Signed)
Pt. Reports that he has put aspirin on hold for 3 days now.

## 2012-07-15 ENCOUNTER — Encounter (HOSPITAL_COMMUNITY): Payer: Self-pay | Admitting: Certified Registered"

## 2012-07-15 ENCOUNTER — Ambulatory Visit (HOSPITAL_COMMUNITY): Payer: Medicare Other | Admitting: Certified Registered"

## 2012-07-15 ENCOUNTER — Encounter (HOSPITAL_COMMUNITY)
Admission: RE | Disposition: A | Discharge status: Home / Self Care | Payer: Self-pay | Source: Ambulatory Visit | Attending: General Surgery

## 2012-07-15 ENCOUNTER — Ambulatory Visit (HOSPITAL_COMMUNITY)
Admission: RE | Admit: 2012-07-15 | Discharge: 2012-07-15 | Disposition: A | Discharge status: Home / Self Care | Payer: Medicare Other | Source: Ambulatory Visit | Attending: General Surgery | Admitting: General Surgery

## 2012-07-15 DIAGNOSIS — K4090 Unilateral inguinal hernia, without obstruction or gangrene, not specified as recurrent: Secondary | ICD-10-CM | POA: Diagnosis not present

## 2012-07-15 DIAGNOSIS — E119 Type 2 diabetes mellitus without complications: Secondary | ICD-10-CM | POA: Diagnosis not present

## 2012-07-15 DIAGNOSIS — I10 Essential (primary) hypertension: Secondary | ICD-10-CM | POA: Diagnosis not present

## 2012-07-15 DIAGNOSIS — Z01818 Encounter for other preprocedural examination: Secondary | ICD-10-CM | POA: Insufficient documentation

## 2012-07-15 DIAGNOSIS — Z01812 Encounter for preprocedural laboratory examination: Secondary | ICD-10-CM | POA: Insufficient documentation

## 2012-07-15 HISTORY — PX: INGUINAL HERNIA REPAIR: SHX194

## 2012-07-15 HISTORY — PX: INSERTION OF MESH: SHX5868

## 2012-07-15 LAB — GLUCOSE, CAPILLARY: Glucose-Capillary: 145 mg/dL — ABNORMAL HIGH (ref 70–99)

## 2012-07-15 SURGERY — REPAIR, HERNIA, INGUINAL, LAPAROSCOPIC
Anesthesia: General | Laterality: Right | Wound class: Clean

## 2012-07-15 MED ORDER — ONDANSETRON HCL 4 MG/2ML IJ SOLN: 4.0000 mg | Freq: Four times a day (QID) | INTRAMUSCULAR | Status: DC | PRN

## 2012-07-15 MED ORDER — OXYCODONE HCL 5 MG/5ML PO SOLN: 5.0000 mg | Freq: Once | ORAL | Status: DC | PRN

## 2012-07-15 MED ORDER — SODIUM CHLORIDE 0.9 % IJ SOLN: 3.0000 mL | INTRAMUSCULAR | Status: DC | PRN

## 2012-07-15 MED ORDER — EPHEDRINE SULFATE 50 MG/ML IJ SOLN
INTRAMUSCULAR | Status: DC | PRN
  Administered 2012-07-15: 10 mg via INTRAVENOUS
  Administered 2012-07-15: 5 mg via INTRAVENOUS

## 2012-07-15 MED ORDER — GLYCOPYRROLATE 0.2 MG/ML IJ SOLN
INTRAMUSCULAR | Status: DC | PRN
  Administered 2012-07-15: .8 mg via INTRAVENOUS

## 2012-07-15 MED ORDER — NEOSTIGMINE METHYLSULFATE 1 MG/ML IJ SOLN
INTRAMUSCULAR | Status: DC | PRN
  Administered 2012-07-15: 5 mg via INTRAVENOUS

## 2012-07-15 MED ORDER — ACETAMINOPHEN 10 MG/ML IV SOLN
INTRAVENOUS | Status: AC
  Filled 2012-07-15: qty 100

## 2012-07-15 MED ORDER — ACETAMINOPHEN 650 MG RE SUPP: 650.0000 mg | RECTAL | Status: DC | PRN

## 2012-07-15 MED ORDER — LACTATED RINGERS IV SOLN
INTRAVENOUS | Status: DC
  Administered 2012-07-15: 12:00:00 via INTRAVENOUS

## 2012-07-15 MED ORDER — LIDOCAINE HCL (CARDIAC) 20 MG/ML IV SOLN
INTRAVENOUS | Status: DC | PRN
  Administered 2012-07-15: 80 mg via INTRAVENOUS

## 2012-07-15 MED ORDER — ACETAMINOPHEN 10 MG/ML IV SOLN
INTRAVENOUS | Status: DC | PRN
  Administered 2012-07-15: 1000 mg via INTRAVENOUS

## 2012-07-15 MED ORDER — OXYCODONE HCL 5 MG PO TABS: 5.0000 mg | ORAL_TABLET | Freq: Once | ORAL | Status: DC | PRN

## 2012-07-15 MED ORDER — OXYCODONE HCL 5 MG PO TABS: 5.0000 mg | ORAL_TABLET | ORAL | Status: DC | PRN

## 2012-07-15 MED ORDER — SODIUM CHLORIDE 0.9 % IV SOLN: 250.0000 mL | INTRAVENOUS | Status: DC | PRN

## 2012-07-15 MED ORDER — ROCURONIUM BROMIDE 100 MG/10ML IV SOLN
INTRAVENOUS | Status: DC | PRN
  Administered 2012-07-15: 50 mg via INTRAVENOUS

## 2012-07-15 MED ORDER — SODIUM CHLORIDE 0.9 % IV SOLN: INTRAVENOUS | Status: DC

## 2012-07-15 MED ORDER — BUPIVACAINE HCL (PF) 0.25 % IJ SOLN
INTRAMUSCULAR | Status: AC
  Filled 2012-07-15: qty 30

## 2012-07-15 MED ORDER — PROPOFOL 10 MG/ML IV BOLUS
INTRAVENOUS | Status: DC | PRN
  Administered 2012-07-15: 200 mg via INTRAVENOUS

## 2012-07-15 MED ORDER — MIDAZOLAM HCL 5 MG/5ML IJ SOLN
INTRAMUSCULAR | Status: DC | PRN
  Administered 2012-07-15: 1 mg via INTRAVENOUS

## 2012-07-15 MED ORDER — ACETAMINOPHEN 325 MG PO TABS: 650.0000 mg | ORAL_TABLET | ORAL | Status: DC | PRN

## 2012-07-15 MED ORDER — SODIUM CHLORIDE 0.9 % IR SOLN
Status: DC | PRN
  Administered 2012-07-15: 1000 mL

## 2012-07-15 MED ORDER — ONDANSETRON HCL 4 MG/2ML IJ SOLN
INTRAMUSCULAR | Status: DC | PRN
  Administered 2012-07-15: 4 mg via INTRAVENOUS

## 2012-07-15 MED ORDER — MUPIROCIN 2 % EX OINT
TOPICAL_OINTMENT | CUTANEOUS | Status: AC
  Administered 2012-07-15: 1 via NASAL
  Filled 2012-07-15: qty 22

## 2012-07-15 MED ORDER — OXYCODONE-ACETAMINOPHEN 7.5-325 MG PO TABS: 1.0000 | ORAL_TABLET | ORAL | Status: DC | PRN

## 2012-07-15 MED ORDER — SODIUM CHLORIDE 0.9 % IJ SOLN: 3.0000 mL | Freq: Two times a day (BID) | INTRAMUSCULAR | Status: DC

## 2012-07-15 MED ORDER — VECURONIUM BROMIDE 10 MG IV SOLR
INTRAVENOUS | Status: DC | PRN
  Administered 2012-07-15: 2 mg via INTRAVENOUS

## 2012-07-15 MED ORDER — FENTANYL CITRATE 0.05 MG/ML IJ SOLN
25.0000 ug | INTRAMUSCULAR | Status: DC | PRN
  Administered 2012-07-15: 25 ug via INTRAVENOUS

## 2012-07-15 MED ORDER — BUPIVACAINE-EPINEPHRINE 0.25% -1:200000 IJ SOLN
INTRAMUSCULAR | Status: DC | PRN
  Administered 2012-07-15 (×2): 10 mL

## 2012-07-15 MED ORDER — ARTIFICIAL TEARS OP OINT
TOPICAL_OINTMENT | OPHTHALMIC | Status: DC | PRN
  Administered 2012-07-15: 1 via OPHTHALMIC

## 2012-07-15 MED ORDER — FENTANYL CITRATE 0.05 MG/ML IJ SOLN
INTRAMUSCULAR | Status: AC
  Filled 2012-07-15: qty 2

## 2012-07-15 MED ORDER — MORPHINE SULFATE 2 MG/ML IJ SOLN: 1.0000 mg | INTRAMUSCULAR | Status: DC | PRN

## 2012-07-15 MED ORDER — FENTANYL CITRATE 0.05 MG/ML IJ SOLN
INTRAMUSCULAR | Status: DC | PRN
  Administered 2012-07-15: 100 ug via INTRAVENOUS
  Administered 2012-07-15 (×2): 50 ug via INTRAVENOUS

## 2012-07-15 SURGICAL SUPPLY — 53 items
ADH SKN CLS APL DERMABOND .7 (GAUZE/BANDAGES/DRESSINGS)
ADH SKN CLS LQ APL DERMABOND (GAUZE/BANDAGES/DRESSINGS) ×1
APL SKNCLS STERI-STRIP NONHPOA (GAUZE/BANDAGES/DRESSINGS)
APPLICATOR COTTON TIP 6IN STRL (MISCELLANEOUS) IMPLANT
APPLIER CLIP 5 13 M/L LIGAMAX5 (MISCELLANEOUS)
APPLIER CLIP ROT 10 11.4 M/L (STAPLE)
APR CLP MED LRG 11.4X10 (STAPLE)
APR CLP MED LRG 5 ANG JAW (MISCELLANEOUS)
BENZOIN TINCTURE PRP APPL 2/3 (GAUZE/BANDAGES/DRESSINGS) IMPLANT
BLADE SURG ROTATE 9660 (MISCELLANEOUS) ×1 IMPLANT
CANISTER SUCTION 2500CC (MISCELLANEOUS) IMPLANT
CHLORAPREP W/TINT 26ML (MISCELLANEOUS) ×2 IMPLANT
CLIP APPLIE 5 13 M/L LIGAMAX5 (MISCELLANEOUS) IMPLANT
CLIP APPLIE ROT 10 11.4 M/L (STAPLE) IMPLANT
CLOTH BEACON ORANGE TIMEOUT ST (SAFETY) ×2 IMPLANT
COVER SURGICAL LIGHT HANDLE (MISCELLANEOUS) ×2 IMPLANT
DECANTER SPIKE VIAL GLASS SM (MISCELLANEOUS) ×1 IMPLANT
DERMABOND ADHESIVE PROPEN (GAUZE/BANDAGES/DRESSINGS) ×1
DERMABOND ADVANCED (GAUZE/BANDAGES/DRESSINGS)
DERMABOND ADVANCED .7 DNX12 (GAUZE/BANDAGES/DRESSINGS) IMPLANT
DERMABOND ADVANCED .7 DNX6 (GAUZE/BANDAGES/DRESSINGS) IMPLANT
DEVICE SECURE STRAP 25 ABSORB (INSTRUMENTS) ×2 IMPLANT
DRAPE INCISE IOBAN 66X45 STRL (DRAPES) IMPLANT
DRAPE UTILITY 15X26 W/TAPE STR (DRAPE) ×4 IMPLANT
DRSG TEGADERM 4X4.75 (GAUZE/BANDAGES/DRESSINGS) IMPLANT
ELECT REM PT RETURN 9FT ADLT (ELECTROSURGICAL) ×2
ELECTRODE REM PT RTRN 9FT ADLT (ELECTROSURGICAL) ×1 IMPLANT
GAUZE SPONGE 2X2 8PLY STRL LF (GAUZE/BANDAGES/DRESSINGS) IMPLANT
GLOVE BIOGEL M STRL SZ7.5 (GLOVE) ×2 IMPLANT
GLOVE BIOGEL PI IND STRL 6.5 (GLOVE) IMPLANT
GLOVE BIOGEL PI IND STRL 8 (GLOVE) ×1 IMPLANT
GLOVE BIOGEL PI INDICATOR 6.5 (GLOVE) ×2
GLOVE BIOGEL PI INDICATOR 8 (GLOVE) ×1
GLOVE SURG SS PI 6.5 STRL IVOR (GLOVE) ×1 IMPLANT
GOWN PREVENTION PLUS XLARGE (GOWN DISPOSABLE) ×2 IMPLANT
GOWN STRL NON-REIN LRG LVL3 (GOWN DISPOSABLE) ×5 IMPLANT
KIT BASIN OR (CUSTOM PROCEDURE TRAY) ×2 IMPLANT
KIT ROOM TURNOVER OR (KITS) ×2 IMPLANT
MESH ULTRAPRO 3X6 7.6X15CM (Mesh General) ×1 IMPLANT
NS IRRIG 1000ML POUR BTL (IV SOLUTION) ×2 IMPLANT
PAD ARMBOARD 7.5X6 YLW CONV (MISCELLANEOUS) ×4 IMPLANT
SCISSORS LAP 5X35 DISP (ENDOMECHANICALS) IMPLANT
SET IRRIG TUBING LAPAROSCOPIC (IRRIGATION / IRRIGATOR) IMPLANT
SPONGE GAUZE 2X2 STER 10/PKG (GAUZE/BANDAGES/DRESSINGS)
SUT MNCRL AB 4-0 PS2 18 (SUTURE) ×2 IMPLANT
SUT VICRYL 0 UR6 27IN ABS (SUTURE) ×1 IMPLANT
TOWEL OR 17X24 6PK STRL BLUE (TOWEL DISPOSABLE) ×2 IMPLANT
TOWEL OR 17X26 10 PK STRL BLUE (TOWEL DISPOSABLE) ×2 IMPLANT
TRAY FOLEY CATH 14FR (SET/KITS/TRAYS/PACK) IMPLANT
TRAY LAPAROSCOPIC (CUSTOM PROCEDURE TRAY) ×2 IMPLANT
TROCAR XCEL BLADELESS 5X75MML (TROCAR) ×4 IMPLANT
TROCAR XCEL BLUNT TIP 100MML (ENDOMECHANICALS) ×2 IMPLANT
WATER STERILE IRR 1000ML POUR (IV SOLUTION) IMPLANT

## 2012-07-15 NOTE — Op Note (Signed)
07/15/2012  Jacob [name] 02-25-1933   PREOPERATIVE DIAGNOSIS: right inguinal hernia.   POSTOPERATIVE DIAGNOSIS: right indirect inguinal hernia.   PROCEDURE: Laparoscopic repair of right indirect inguinal hernia with  mesh (TAPP).   SURGEON: [name]. Andrey Campanile, MD   ASSISTANT SURGEON: None.   ANESTHESIA: General plus local consisting of 0.25% Marcaine with epi.   ESTIMATED BLOOD LOSS: Minimal.   FINDINGS: The patient had a right indirect inguinal hernia.  It was repaired using a 3 inch x 6  inch piece of Ethicon UltraPro mesh.   SPECIMEN: none  INDICATIONS FOR PROCEDURE: 79 yo WM with a symptomatic right inguinal hernia desired repair.  The risks and benefits including but not limited to bleeding, infection, chronic inguinal pain, nerve entrapment, hernia recurrence, mesh complications, hematoma formation, urinary retention, injury to the testicles or the ovaries, numbness in the groin, blood clots, injury to the surrounding structures, and anesthesia risk was discussed with the patient.  DESCRIPTION OF PROCEDURE: After obtaining verbal consent and marking  the right groin in the holding area with the patient confirming the  operative site, the patient was then taken back to the operating room, placed  supine on the operating room table. General endotracheal anesthesia was  established. The patient had emptied their bladder prior to going back to  the operating room. Sequential compression devices were placed. The  abdomen and groin were prepped and draped in the usual standard surgical  fashion with ChloraPrep. The patient received IV Tylenol as well as IV  antibiotics prior to the incision. A surgical time-out was performed.  Local was infiltrated at the base of the umbilicus.   Next, a 1-cm vertical infraumbilical incision was made with a #11 blade. The fascia  was grasped and lifted anteriorly. Next, the fascia was incised, and  the abdominal cavity was entered. Pursestring  suture was placed around  the fascial edges using a 0 Vicryl. A 12-mm Hasson trocar was placed.  Pneumoperitoneum was smoothly established up to a patient pressure of 15  mmHg. Laparoscope was advanced. There was no evidence of a  contralateral hernia. The patient had a defect lateral to  the inferior epigastric vessel, consistent with an right indirect  hernia. Two 5-mm trocars were placed, one on the right, one on the left  in the midclavicular line slightly above the level of the umbilicus all  under direct visualization. After local had been infiltrated, I then  made incision along the peritoneum on the right, starting 2 inches above  the anterior superior iliac spine and caring it medial  toward the median umbilical ligament in a lazy S configuration using  Endo Shears with electrocautery. The peritoneal flap was then gently  dissected downward from the anterior abdominal wall taking care not to  injure the inferior epigastric vessels. The pubic bone was identified.  The testicular vessels were identified.  Using  traction and counter traction with short graspers, I reduced the sac in  its entirety. The testicular vessels had been identified and preserved. The vas deferens was identified and preserved, and the hernia sac was stripped from those to  surrounding structures. I then went about creating a large pocket by  lifting the peritoneum of the pelvic floor. I took great care not to  injure the iliac vessels.   Local anesthetic was injected 1 finger breadths below and medial to the anterior superior iliac spine. I then obtained a piece of Ethicon UltraPro mesh 3 inch x 6 inch, placed it through the  Hasson trocar, half of it covered medial  to the inferior epigastric vessels and half of it lateral to the  inferior epigastric vessels. The defect was well  covered with the mesh. I then secured the mesh to the abdominal wall  using an Ethicon secure strap tack. Tacks were placed through    the Cooper's ligament, one at the superior medial aspect, one tack on each side of the inferior epigastric  vessel and two tacks out laterally. No tacks were placed below the  shelving edge of the inguinal ligament. Pneumoperitoneum was reduced  to 8 mmHg. I then brought the peritoneal flap back up to the abdominal  wall and tacked it to the abdominal wall using 4 tacks. There was no  defect in the peritoneum, and the mesh was well covered. I removed the  Hasson trocar and tied down the previously placed pursestring suture.  The closure was viewed laparoscopically. There was no evidence of  fascial defect but it had a little weakness so I placed an additional figure of eight 0-vicryl suture. There was no air leak at the umbilicus. There was no  evidence of injury to surrounding structures. Pneumoperitoneum was  released, and the remaining trocars were removed. All skin incisions  were closed with a 4-0 Monocryl in a subcuticular fashion followed by  application of Dermabond. All needle, instrument, and sponge counts  were correct x2. There are no immediate complications. The patient  tolerated the procedure well. The patient was extubated and taken to the  recovery room in stable condition.  [name]. Andrey Campanile, MD, FACS General, Bariatric, & Minimally Invasive Surgery [facility], [place]

## 2012-07-15 NOTE — Preoperative (Signed)
Beta Blockers   Reason not to administer Beta Blockers:Not Applicable, last dose 07/15/12 at 0800

## 2012-07-15 NOTE — Anesthesia Procedure Notes (Signed)
Procedure Name: Intubation Date/Time: 07/15/2012 12:44 PM Performed by: Jefm Miles E Pre-anesthesia Checklist: Patient identified, Timeout performed, Emergency Drugs available, Suction available and Patient being monitored Patient Re-evaluated:Patient Re-evaluated prior to inductionOxygen Delivery Method: Circle system utilized Preoxygenation: Pre-oxygenation with 100% oxygen Intubation Type: IV induction Ventilation: Mask ventilation without difficulty Laryngoscope Size: Mac and 4 Grade View: Grade I Tube type: Oral Tube size: 7.5 mm Number of attempts: 1 Airway Equipment and Method: Stylet Placement Confirmation: ETT inserted through vocal cords under direct vision,  breath sounds checked- equal and bilateral and positive ETCO2 Secured at: 23 cm Tube secured with: Tape Dental Injury: Teeth and Oropharynx as per pre-operative assessment

## 2012-07-15 NOTE — Progress Notes (Signed)
Attempting to place patient in ssc, spoke with Jacob [name], expecting a call back

## 2012-07-15 NOTE — Interval H&P Note (Signed)
History and Physical Interval Note:  07/15/2012 11:43 AM  Jacob [name]  has presented today for surgery, with the diagnosis of right inguinal hernia  The various methods of treatment have been discussed with the patient and family. After consideration of risks, benefits and other options for treatment, the patient has consented to  Procedure(s) (LRB) with comments: LAPAROSCOPIC INGUINAL HERNIA (Right) - laparosscopic repair right inguinal hernia with mesh INSERTION OF MESH (Right) as a surgical intervention .  The patient's history has been reviewed, patient examined, no change in status, stable for surgery.  I have reviewed the patient's chart and labs.  Questions were answered to the patient's satisfaction.   Jacob [name]. Andrey Campanile, MD, FACS General, Bariatric, & Minimally Invasive Surgery [facility], [place]   [facility] M

## 2012-07-15 NOTE — Anesthesia Preprocedure Evaluation (Addendum)
Anesthesia Evaluation  Patient identified by MRN, date of birth, ID band Patient awake    Reviewed: Allergy & Precautions, H&P , NPO status , Patient's Chart, lab work & pertinent test results, reviewed documented beta blocker date and time   Airway Mallampati: II TM Distance: >3 FB Neck ROM: Full    Dental  (+) Teeth Intact and Dental Advisory Given   Pulmonary sleep apnea and Continuous Positive Airway Pressure Ventilation ,  breath sounds clear to auscultation        Cardiovascular hypertension, Pt. on home beta blockers + CAD, + Past MI and + CABG Rhythm:Regular Rate:Normal     Neuro/Psych  Headaches, PSYCHIATRIC DISORDERS Anxiety Depression    GI/Hepatic   Endo/Other  diabetes, Type 2  Renal/GU      Musculoskeletal   Abdominal   Peds  Hematology   Anesthesia Other Findings   Reproductive/Obstetrics                         Anesthesia Physical Anesthesia Plan  ASA: III  Anesthesia Plan: General   Post-op Pain Management:    Induction: Intravenous  Airway Management Planned: Oral ETT  Additional Equipment:   Intra-op Plan:   Post-operative Plan: Extubation in OR  Informed Consent: I have reviewed the patients History and Physical, chart, labs and discussed the procedure including the risks, benefits and alternatives for the proposed anesthesia with the patient or authorized representative who has indicated his/her understanding and acceptance.     Plan Discussed with: CRNA and Surgeon  Anesthesia Plan Comments:         Anesthesia Quick Evaluation

## 2012-07-15 NOTE — Anesthesia Postprocedure Evaluation (Signed)
Anesthesia Post Note  Patient: Jacob [name]  Procedure(s) Performed: Procedure(s) (LRB): LAPAROSCOPIC INGUINAL HERNIA (Right) INSERTION OF MESH (Right)  Anesthesia type: General  Patient location: PACU  Post pain: Pain level controlled and Adequate analgesia  Post assessment: Post-op Vital signs reviewed, Patient's Cardiovascular Status Stable, Respiratory Function Stable, Patent Airway and Pain level controlled  Last Vitals:  Filed Vitals:   07/15/12 1415  BP:   Pulse: 59  Temp:   Resp: 15    Post vital signs: Reviewed and stable  Level of consciousness: awake, alert  and oriented  Complications: No apparent anesthesia complications

## 2012-07-15 NOTE — H&P (View-Only) (Signed)
Patient ID: Jacob [name], male   DOB: 08/02/1932, 79 y.o.   MRN: [id number]  Chief Complaint  Patient presents with  . Inguinal Hernia    new pt- eval Right ing hernia    HPI Jacob [name] is a 79 y.o. male.   HPI 79yo WM referred by Diane Warden, NP at [facility] for evaluation of a right inguinal hernia. The patient states that his symptoms began about 3 months ago with right groin pain. He describes his pain as a hot poker between his legs. He states that it felt like a severe burning sensation in his groin. He initially thought it was due to inguinal strain. He went to his primary care physician after about one month symptoms. A CT scan was ordered which was unrevealing. He states that he was sent to a rehabilitation center which did not give him any relief. He end up seeing his urologist because of ongoing discomfort. They felt that he had a right inguinal hernia and he was referred here. He states that the burning stinging sensation has essentially gone. He will only have it on occasion. He definitely feels a lump that was not there several months ago. The lump is more noticeable toward the end of the day or if he has been active. He denies any nausea, vomiting, diarrhea or constipation. He does have a history of BPH and takes medication for it on a daily basis. He endorses a weak stream as well as nocturia. He denies any dysuria.  Dr. Smith at [facility] is his cardiologist. Past Medical History  Diagnosis Date  . Diabetes mellitus without complication   . Hyperlipidemia   . Hypertension   . Myocardial infarction 2005    Past Surgical History  Procedure Date  . Coronary artery bypass graft 2005    triple bypass    Family History  Problem Relation Age of Onset  . Cancer Sister     lung and breast  . Cancer Brother     lung    Social History History  Substance Use Topics  . Smoking status: Former Smoker  . Smokeless tobacco: Not on file  . Alcohol Use: Yes     Comment:  occasional    Allergies  Allergen Reactions  . Ivp Dye (Iodinated Diagnostic Agents)     Current Outpatient Prescriptions  Medication Sig Dispense Refill  . acetaminophen (TYLENOL) 650 MG suppository Place 650 mg rectally every 4 (four) hours as needed.      . alfuzosin (UROXATRAL) 10 MG 24 hr tablet daily.      . amLODipine (NORVASC) 2.5 MG tablet Take 2.5 mg by mouth daily.      . aspirin 325 MG tablet Take 325 mg by mouth daily.      . carvedilol (COREG) 25 MG tablet BID times 48H.      . ibuprofen (ADVIL,MOTRIN) 100 MG tablet Take 100 mg by mouth every 6 (six) hours as needed.      . ketotifen (ZADITOR) 0.025 % ophthalmic solution 1 drop 2 (two) times daily.      . losartan (COZAAR) 100 MG tablet BID times 48H.      . meloxicam (MOBIC) 15 MG tablet Take 15 mg by mouth daily.      . metFORMIN (GLUCOPHAGE) 500 MG tablet Take 500 mg by mouth 2 (two) times daily with a meal.      . NITROSTAT 0.4 MG SL tablet Ad lib.      . simvastatin (ZOCOR) 40 MG   tablet Take 40 mg by mouth every evening.        Review of Systems Review of Systems  Constitutional: Negative for fever, chills, appetite change and unexpected weight change.  HENT: Negative for congestion and trouble swallowing.   Eyes: Negative for visual disturbance.  Respiratory: Positive for cough. Negative for chest tightness and shortness of breath.        Uses CPAP  Cardiovascular: Negative for chest pain and leg swelling.       No PND, no orthopnea, no DOE  Gastrointestinal:       See HPI  Genitourinary: Negative for dysuria and hematuria.       +nocturia, weak stream; nonobstructing kidney stones  Musculoskeletal: Negative.   Skin: Negative for rash.  Neurological: Negative for seizures and speech difficulty.  Hematological: Does not bruise/bleed easily.  Psychiatric/Behavioral: Negative for behavioral problems and confusion.    Blood pressure 118/70, pulse 72, temperature 98.1 F (36.7 C), temperature source  Temporal, resp. rate 18, height 6' 4" (1.93 m), weight 262 lb 9.6 oz (119.115 kg).  Physical Exam Physical Exam  Vitals reviewed. Constitutional: He is oriented to person, place, and time. He appears well-developed and well-nourished. No distress.       obese  HENT:  Head: Normocephalic and atraumatic.  Right Ear: External ear normal.  Left Ear: External ear normal.  Eyes: Conjunctivae normal are normal.  Neck: Normal range of motion. Neck supple. No tracheal deviation present. No thyromegaly present.  Cardiovascular: Normal rate, regular rhythm and normal heart sounds.   Pulmonary/Chest: Breath sounds normal. No respiratory distress. He has no wheezes. He exhibits no tenderness.  Abdominal: Soft. Bowel sounds are normal. He exhibits no distension. There is no tenderness. There is no rebound. Hernia confirmed negative in the right inguinal area and confirmed negative in the left inguinal area.       Small umbilical hernia - 0.5cm; reducible, nontender  Genitourinary:    Right testis shows tenderness. Right testis shows no mass. Left testis shows tenderness. Left testis shows no mass. Uncircumcised.       Reducible hernia - feels c/w direct hernia  Musculoskeletal: He exhibits no edema and no tenderness.  Lymphadenopathy:    He has no cervical adenopathy.  Neurological: He is alert and oriented to person, place, and time.  Skin: Skin is warm and dry. No rash noted. He is not diaphoretic. No erythema.  Psychiatric: He has a normal mood and affect. His behavior is normal. Thought content normal.    Data Reviewed [facility] Diane Warden note from 06/19/12 CT abd/pevlis from 05/2012 - kidney stones- nonobstructing; right groin appears to have inguinal hernia Assessment    Right inguinal hernia    Plan    We discussed the etiology of inguinal hernias. We discussed the signs & symptoms of incarceration & strangulation.  We discussed non-operative and operative management. We  discussed both open and laparoscopic repairs. We discussed the pros and cons of each approach.  The patient has elected to proceed with a laparoscopic repair of right inguinal hernia with mesh    I described the procedure in detail.  The patient was given educational material. We discussed the risks and benefits including but not limited to bleeding, infection, chronic inguinal pain, nerve entrapment, hernia recurrence, mesh complications, hematoma formation, urinary retention, injury to the testicles or the ovaries, numbness in the groin, blood clots, injury to the surrounding structures, and anesthesia risk. We also discussed the typical post operative recovery course, including   no heavy lifting for 4-6 weeks. I explained that the likelihood of improvement of their symptoms is good.I explained that he is at higher risk for urinary retention because of his BPH issues.  He would like to wait a week or 2 until his cold has completely resolved which I completely agree with.  Hadia Minier M. Sair Faulcon, MD, FACS General, Bariatric, & Minimally Invasive Surgery [facility]         Sonja Manseau M 07/03/2012, 2:52 PM    

## 2012-07-15 NOTE — Transfer of Care (Signed)
Immediate Anesthesia Transfer of Care Note  Patient: Jacob [name]  Procedure(s) Performed: Procedure(s) (LRB) with comments: LAPAROSCOPIC INGUINAL HERNIA (Right) - laparoscopic repair right inguinal hernia with mesh INSERTION OF MESH (Right) - right inguinal area  Patient Location: PACU  Anesthesia Type:General  Level of Consciousness: awake, alert  and oriented  Airway & Oxygen Therapy: Patient Spontanous Breathing and Patient connected to nasal cannula oxygen  Post-op Assessment: Report given to PACU RN  Post vital signs: Reviewed and stable  Complications: No apparent anesthesia complications

## 2012-07-16 NOTE — Progress Notes (Addendum)
Pt states he feels as if he is not emptying his bladder fully, but no distention.  Encouraged pt to push fluids today and if it still feels this way this afternoon to notify Dr Andrey Campanile before the office closes for the day, and if too uncomfortable to go to the ED. Pt states understanding with teach back.

## 2012-07-17 ENCOUNTER — Encounter (HOSPITAL_COMMUNITY): Payer: Self-pay | Admitting: General Surgery

## 2012-07-22 DIAGNOSIS — Z85828 Personal history of other malignant neoplasm of skin: Secondary | ICD-10-CM | POA: Diagnosis not present

## 2012-07-22 DIAGNOSIS — L570 Actinic keratosis: Secondary | ICD-10-CM | POA: Diagnosis not present

## 2012-07-24 DIAGNOSIS — R9389 Abnormal findings on diagnostic imaging of other specified body structures: Secondary | ICD-10-CM | POA: Diagnosis not present

## 2012-07-24 DIAGNOSIS — E119 Type 2 diabetes mellitus without complications: Secondary | ICD-10-CM | POA: Diagnosis not present

## 2012-07-24 DIAGNOSIS — E785 Hyperlipidemia, unspecified: Secondary | ICD-10-CM | POA: Diagnosis not present

## 2012-07-24 DIAGNOSIS — I10 Essential (primary) hypertension: Secondary | ICD-10-CM | POA: Diagnosis not present

## 2012-07-24 DIAGNOSIS — I2510 Atherosclerotic heart disease of native coronary artery without angina pectoris: Secondary | ICD-10-CM | POA: Diagnosis not present

## 2012-07-24 DIAGNOSIS — G4730 Sleep apnea, unspecified: Secondary | ICD-10-CM | POA: Diagnosis not present

## 2012-07-24 DIAGNOSIS — D696 Thrombocytopenia, unspecified: Secondary | ICD-10-CM | POA: Diagnosis not present

## 2012-07-24 DIAGNOSIS — Z0000 Encounter for general adult medical examination without abnormal findings: Secondary | ICD-10-CM | POA: Diagnosis not present

## 2012-07-30 DIAGNOSIS — R9389 Abnormal findings on diagnostic imaging of other specified body structures: Secondary | ICD-10-CM | POA: Diagnosis not present

## 2012-07-30 DIAGNOSIS — D649 Anemia, unspecified: Secondary | ICD-10-CM | POA: Diagnosis not present

## 2012-07-30 DIAGNOSIS — I10 Essential (primary) hypertension: Secondary | ICD-10-CM | POA: Diagnosis not present

## 2012-08-06 ENCOUNTER — Encounter (INDEPENDENT_AMBULATORY_CARE_PROVIDER_SITE_OTHER): Payer: Self-pay | Admitting: General Surgery

## 2012-08-06 ENCOUNTER — Ambulatory Visit (INDEPENDENT_AMBULATORY_CARE_PROVIDER_SITE_OTHER): Payer: Medicare Other | Admitting: General Surgery

## 2012-08-06 VITALS — BP 122/72 | HR 79 | Temp 95.3°F | Resp 18 | Ht 74.0 in | Wt 267.8 lb

## 2012-08-06 DIAGNOSIS — Z09 Encounter for follow-up examination after completed treatment for conditions other than malignant neoplasm: Secondary | ICD-10-CM

## 2012-08-06 NOTE — Patient Instructions (Signed)
Can resume full activities after 08/28/12

## 2012-08-06 NOTE — Progress Notes (Signed)
Subjective:     Patient ID: Jacob [name], male   DOB: 05/24/1933, 79 y.o.   MRN: [id number]  HPI 79 year old Caucasian male comes in for his postoperative visit after undergoing a laparoscopic repair of a right indirect inguinal hernia with mesh on January 14. He states that he did well after surgery. He only took 2 pain pills after surgery. He states that he did not have any pain after the first week. He denies any fevers, chills, nausea, vomiting, diarrhea or constipation. He denies any trouble urinating. He states last Saturday he jumped up quickly out of bed and he had some discomfort in his right groin that is resolved. He denies any inguinal numbness or tingling.  Review of Systems     Objective:   Physical Exam BP 122/72  Pulse 79  Temp 95.3 F (35.2 C) (Temporal)  Resp 18  Ht 6\' 2"  (1.88 m)  Wt 267 lb 12.8 oz (121.473 kg)  BMI 34.38 kg/m2  Gen: alert, NAD, non-toxic appearing Pulm: Lungs clear to auscultation, symmetric chest rise CV: regular rate and rhythm Abd: soft, nontender, nondistended. Well-healed trocar sites. No cellulitis. No incisional hernia GU: both testicles descended, no scrotal masses, no sign of hernia recurrence Ext: no edema     Assessment:     Status post laparoscopic repair of right indirect inguinal hernia with mesh January 14    Plan:     Overall I think he is doing quite well. I reminded him he should not do full activities until after February 27. In the interim I told him he can do light activity such as walking on a treadmill area. since he is doing so well he will followup as needed  [name]. Andrey Campanile, MD, FACS General, Bariatric, & Minimally Invasive Surgery [facility], [place]

## 2012-09-10 DIAGNOSIS — E1139 Type 2 diabetes mellitus with other diabetic ophthalmic complication: Secondary | ICD-10-CM | POA: Diagnosis not present

## 2012-10-29 ENCOUNTER — Other Ambulatory Visit: Payer: Self-pay | Admitting: Family Medicine

## 2012-10-29 DIAGNOSIS — R9389 Abnormal findings on diagnostic imaging of other specified body structures: Secondary | ICD-10-CM

## 2012-11-05 ENCOUNTER — Ambulatory Visit
Admission: RE | Admit: 2012-11-05 | Discharge: 2012-11-05 | Disposition: A | Discharge status: Home / Self Care | Payer: Medicare Other | Source: Ambulatory Visit | Attending: Family Medicine | Admitting: Family Medicine

## 2012-11-05 DIAGNOSIS — R9389 Abnormal findings on diagnostic imaging of other specified body structures: Secondary | ICD-10-CM

## 2012-11-21 ENCOUNTER — Inpatient Hospital Stay
Admission: RE | Admit: 2012-11-21 | Discharge: 2012-11-21 | Disposition: A | Discharge status: Home / Self Care | Payer: Medicare Other | Source: Ambulatory Visit | Attending: Family Medicine | Admitting: Family Medicine

## 2012-11-21 ENCOUNTER — Ambulatory Visit
Admission: RE | Admit: 2012-11-21 | Discharge: 2012-11-21 | Disposition: A | Discharge status: Home / Self Care | Payer: Medicare Other | Source: Ambulatory Visit | Attending: Family Medicine | Admitting: Family Medicine

## 2012-11-21 DIAGNOSIS — R918 Other nonspecific abnormal finding of lung field: Secondary | ICD-10-CM | POA: Diagnosis not present

## 2012-11-27 DIAGNOSIS — G4730 Sleep apnea, unspecified: Secondary | ICD-10-CM | POA: Diagnosis not present

## 2012-11-27 DIAGNOSIS — I10 Essential (primary) hypertension: Secondary | ICD-10-CM | POA: Diagnosis not present

## 2012-11-27 DIAGNOSIS — E119 Type 2 diabetes mellitus without complications: Secondary | ICD-10-CM | POA: Diagnosis not present

## 2012-11-27 DIAGNOSIS — R9389 Abnormal findings on diagnostic imaging of other specified body structures: Secondary | ICD-10-CM | POA: Diagnosis not present

## 2012-11-27 DIAGNOSIS — I2510 Atherosclerotic heart disease of native coronary artery without angina pectoris: Secondary | ICD-10-CM | POA: Diagnosis not present

## 2012-11-27 DIAGNOSIS — D649 Anemia, unspecified: Secondary | ICD-10-CM | POA: Diagnosis not present

## 2012-11-27 DIAGNOSIS — Z0000 Encounter for general adult medical examination without abnormal findings: Secondary | ICD-10-CM | POA: Diagnosis not present

## 2012-11-27 DIAGNOSIS — D696 Thrombocytopenia, unspecified: Secondary | ICD-10-CM | POA: Diagnosis not present

## 2013-01-20 DIAGNOSIS — Z85828 Personal history of other malignant neoplasm of skin: Secondary | ICD-10-CM | POA: Diagnosis not present

## 2013-01-20 DIAGNOSIS — L570 Actinic keratosis: Secondary | ICD-10-CM | POA: Diagnosis not present

## 2013-02-10 DIAGNOSIS — E119 Type 2 diabetes mellitus without complications: Secondary | ICD-10-CM | POA: Diagnosis not present

## 2013-02-10 DIAGNOSIS — L03039 Cellulitis of unspecified toe: Secondary | ICD-10-CM | POA: Diagnosis not present

## 2013-02-10 DIAGNOSIS — L608 Other nail disorders: Secondary | ICD-10-CM | POA: Diagnosis not present

## 2013-03-07 DIAGNOSIS — Z23 Encounter for immunization: Secondary | ICD-10-CM | POA: Diagnosis not present

## 2013-04-08 DIAGNOSIS — E119 Type 2 diabetes mellitus without complications: Secondary | ICD-10-CM | POA: Diagnosis not present

## 2013-04-08 DIAGNOSIS — L03039 Cellulitis of unspecified toe: Secondary | ICD-10-CM | POA: Diagnosis not present

## 2013-04-08 DIAGNOSIS — R52 Pain, unspecified: Secondary | ICD-10-CM | POA: Diagnosis not present

## 2013-04-08 DIAGNOSIS — L608 Other nail disorders: Secondary | ICD-10-CM | POA: Diagnosis not present

## 2013-04-20 DIAGNOSIS — E119 Type 2 diabetes mellitus without complications: Secondary | ICD-10-CM | POA: Diagnosis not present

## 2013-04-20 DIAGNOSIS — L03039 Cellulitis of unspecified toe: Secondary | ICD-10-CM | POA: Diagnosis not present

## 2013-05-10 ENCOUNTER — Encounter: Payer: Self-pay | Admitting: Interventional Cardiology

## 2013-05-13 ENCOUNTER — Encounter: Payer: Self-pay | Admitting: Interventional Cardiology

## 2013-05-13 ENCOUNTER — Ambulatory Visit (INDEPENDENT_AMBULATORY_CARE_PROVIDER_SITE_OTHER): Payer: Medicare Other | Admitting: Interventional Cardiology

## 2013-05-13 ENCOUNTER — Encounter (INDEPENDENT_AMBULATORY_CARE_PROVIDER_SITE_OTHER): Payer: Self-pay

## 2013-05-13 VITALS — BP 106/74 | HR 66 | Ht 78.0 in | Wt 271.0 lb

## 2013-05-13 DIAGNOSIS — I10 Essential (primary) hypertension: Secondary | ICD-10-CM | POA: Diagnosis not present

## 2013-05-13 DIAGNOSIS — E785 Hyperlipidemia, unspecified: Secondary | ICD-10-CM | POA: Diagnosis not present

## 2013-05-13 DIAGNOSIS — I2510 Atherosclerotic heart disease of native coronary artery without angina pectoris: Secondary | ICD-10-CM | POA: Diagnosis not present

## 2013-05-13 NOTE — Progress Notes (Signed)
Patient ID: Jacob [name], male   DOB: 16-May-1933, 79 y.o.   MRN: [id number]    [address] [place], [place]  [address] Phone: [phone] Fax:  [phone]  Date:  05/13/2013   ID:  Jacob [name], DOB May 16, 1933, MRN [id number]  PCP:  Mickie Hillier, MD   ASSESSMENT:  1. Coronary atherosclerosis with prior coronary bypass surgery 2004, asymptomatic 2. Hypertension with varying recent blood pressure determinations 3. Hyperlipidemia, followed by the primary physician  PLAN:  1. Closely monitor blood pressure and call if consistently elevated readings 2. Increase physical activity and maintain an active lifestyle 3. No particular testing is felt to be indicated at this 4. Clinical followup in one year. Earlier if chest discomfort or concerns   SUBJECTIVE: Jacob [name] is a 79 y.o. male who denies cardiopulmonary complaints. He is not as active as he once was. His joints prevent exercise as much as he would like. He denies orthopnea and PND. No anginal complaints. He has not had palpitations or syncope. Is no peripheral edema.   Wt Readings from Last 3 Encounters:  05/13/13 271 lb (122.925 kg)  08/06/12 267 lb 12.8 oz (121.473 kg)  07/14/12 261 lb 1.6 oz (118.434 kg)     Past Medical History  Diagnosis Date  . Diabetes mellitus without complication   . Hyperlipidemia   . Myocardial infarction 2005  . Hypertension     followed by Dr. Mendel Ryder   . Depression   . Anxiety   . Sleep apnea     CPAP almost q night   . Cold     common cold, reports that he has a lingering cold   . Headache(784.0)     sinus headaches   . Arthritis     degenerative spine     Current Outpatient Prescriptions  Medication Sig Dispense Refill  . alfuzosin (UROXATRAL) 10 MG 24 hr tablet Take 10 mg by mouth daily.       [name] alum & mag hydroxide-simeth (MAALOX PLUS) 400-400-40 MG/5ML suspension Take 10 mLs by mouth every 6 (six) hours as needed.      [name] aspirin 325 MG tablet Take 325  mg by mouth daily.      . carvedilol (COREG) 25 MG tablet 25 mg 3 (three) times daily - between meals and at bedtime.       [name] ibuprofen (ADVIL,MOTRIN) 100 MG tablet Take 200 mg by mouth daily. For pain      . losartan (COZAAR) 100 MG tablet Take 100 mg by mouth at bedtime.       . metFORMIN (GLUCOPHAGE) 500 MG tablet Take 500 mg by mouth 2 (two) times daily with a meal.      . NITROSTAT 0.4 MG SL tablet Place 0.4 mg under the tongue daily as needed. For chest pain      . sertraline (ZOLOFT) 50 MG tablet Take 50 mg by mouth daily.      . simvastatin (ZOCOR) 40 MG tablet Take 40 mg by mouth at bedtime.        No current facility-administered medications for this visit.    Allergies:    Allergies  Allergen Reactions  . Ivp Dye [Iodinated Diagnostic Agents]     Social History:  The patient  reports that he has quit smoking. He quit smokeless tobacco use about 28 years ago. He reports that he drinks alcohol. He reports that he does not use illicit drugs.   ROS:  Please see  the history of present illness.   Complies with medical regimen. Denies claudication. Has not had syncope. No neurological complaints.   All other systems reviewed and negative.   OBJECTIVE: VS:  BP 106/74  Pulse 66  Ht 6\' 6"  (1.981 m)  Wt 271 lb (122.925 kg)  BMI 31.32 kg/m2 Well nourished, well developed, in no acute distress, obese HEENT: normal Neck: JVD flat. Carotid bruit absent bruits  Cardiac:  normal S1, S2; RRR; no murmur Lungs:  clear to auscultation bilaterally, no wheezing, rhonchi or rales Abd: soft, nontender, no hepatomegaly Ext: Edema absent. Pulses 2+ Skin: warm and dry Neuro:  CNs 2-12 intact, no focal abnormalities noted  EKG:  Not performed       Signed, Darci Needle III, MD 05/13/2013 10:08 AM

## 2013-05-13 NOTE — Patient Instructions (Signed)
Your physician recommends that you continue on your current medications as directed. Please refer to the Current Medication list given to you today.  Your physician wants you to follow-up in: 1 year You will receive a reminder letter in the mail two months in advance. If you don't receive a letter, please call our office to schedule the follow-up appointment.  Continue to maintain an active lifestyle 

## 2013-06-06 ENCOUNTER — Emergency Department: Admission: EM | Admit: 2013-06-06 | Discharge: 2013-06-06 | Discharge status: Absent without leave | Payer: Self-pay

## 2013-06-10 ENCOUNTER — Emergency Department (INDEPENDENT_AMBULATORY_CARE_PROVIDER_SITE_OTHER)
Admission: EM | Admit: 2013-06-10 | Discharge: 2013-06-10 | Disposition: A | Discharge status: Home / Self Care | Payer: Medicare Other | Source: Home / Self Care | Attending: Family Medicine | Admitting: Family Medicine

## 2013-06-10 ENCOUNTER — Encounter: Payer: Self-pay | Admitting: Emergency Medicine

## 2013-06-10 DIAGNOSIS — J0100 Acute maxillary sinusitis, unspecified: Secondary | ICD-10-CM

## 2013-06-10 DIAGNOSIS — J069 Acute upper respiratory infection, unspecified: Secondary | ICD-10-CM

## 2013-06-10 MED ORDER — BENZONATATE 200 MG PO CAPS: 200.0000 mg | ORAL_CAPSULE | Freq: Every day | ORAL | Status: DC

## 2013-06-10 MED ORDER — AMOXICILLIN 875 MG PO TABS: 875.0000 mg | ORAL_TABLET | Freq: Two times a day (BID) | ORAL | Status: DC

## 2013-06-10 NOTE — ED Provider Notes (Signed)
CSN: [id number]     Arrival date & time 06/10/13  1136 History   First MD Initiated Contact with Patient 06/10/13 1211     Chief Complaint  Patient presents with  . Cough  . Nasal Congestion  . Facial Pain      HPI Comments: Patient complains of onset of URI symptoms about 11 days ago with fatigue, dizziness, myalgias, cough, sore throat and sinus congestion.  His cough is productive and worse at night.  He has now developed facial pressure and yellow mucous drainage.  The history is provided by the patient.    Past Medical History  Diagnosis Date  . Diabetes mellitus without complication   . Hyperlipidemia   . Myocardial infarction 2005  . Hypertension     followed by Dr. Mendel Ryder   . Depression   . Anxiety   . Sleep apnea     CPAP almost q night   . Cold     common cold, reports that he has a lingering cold   . Headache(784.0)     sinus headaches   . Arthritis     degenerative spine    Past Surgical History  Procedure Laterality Date  . Coronary artery bypass graft  2005    triple bypass  . Cardiac catheterization    . Eye surgery      lasik procedure   . Inguinal hernia repair  07/15/2012    Procedure: LAPAROSCOPIC INGUINAL HERNIA;  Surgeon: Atilano Ina, MD,FACS;  Location: [facility] OR;  Service: General;  Laterality: Right;  laparoscopic repair right inguinal hernia with mesh  . Insertion of mesh  07/15/2012    Procedure: INSERTION OF MESH;  Surgeon: Atilano Ina, MD,FACS;  Location: [facility] OR;  Service: General;  Laterality: Right;  right inguinal area   Family History  Problem Relation Age of Onset  . Cancer Sister     lung and breast  . Cancer Brother     lung   History  Substance Use Topics  . Smoking status: Former Games developer  . Smokeless tobacco: Former Neurosurgeon    Quit date: 06/13/1984  . Alcohol Use: Yes     Comment: occasional- "once per week" - one drink    Review of Systems + sore throat + cough No pleuritic pain No wheezing + nasal congestion +  post-nasal drainage + sinus pain/pressure No itchy/red eyes + earache No hemoptysis No SOB No fever/chills No nausea No vomiting No abdominal pain No diarrhea No urinary symptoms No skin rash + fatigue + myalgias + headache Used OTC meds without relief  Allergies  Ivp dye  Home Medications   Current Outpatient Rx  Name  Route  Sig  Dispense  Refill  . acetaminophen (TYLENOL) 650 MG CR tablet   Oral   Take 650 mg by mouth every 8 (eight) hours as needed for pain.         . hydrochlorothiazide (MICROZIDE) 12.5 MG capsule   Oral   Take 12.5 mg by mouth daily.         [name] loratadine (CLARITIN) 10 MG tablet   Oral   Take 10 mg by mouth daily.         . meloxicam (MOBIC) 15 MG tablet   Oral   Take 15 mg by mouth daily.         [name] alfuzosin (UROXATRAL) 10 MG 24 hr tablet   Oral   Take 10 mg by mouth daily.          [name]  alum & mag hydroxide-simeth (MAALOX PLUS) 400-400-40 MG/5ML suspension   Oral   Take 10 mLs by mouth every 6 (six) hours as needed.         [name] amoxicillin (AMOXIL) 875 MG tablet   Oral   Take 1 tablet (875 mg total) by mouth 2 (two) times daily.   20 tablet   0   . aspirin 325 MG tablet   Oral   Take 325 mg by mouth daily.         . benzonatate (TESSALON) 200 MG capsule   Oral   Take 1 capsule (200 mg total) by mouth at bedtime. Take as needed for cough   12 capsule   0   . carvedilol (COREG) 25 MG tablet      25 mg 3 (three) times daily - between meals and at bedtime.          [name] ibuprofen (ADVIL,MOTRIN) 100 MG tablet   Oral   Take 200 mg by mouth daily. For pain         . losartan (COZAAR) 100 MG tablet   Oral   Take 100 mg by mouth at bedtime.          . metFORMIN (GLUCOPHAGE) 500 MG tablet   Oral   Take 500 mg by mouth 2 (two) times daily with a meal.         . NITROSTAT 0.4 MG SL tablet   Sublingual   Place 0.4 mg under the tongue daily as needed. For chest pain         . sertraline (ZOLOFT) 50 MG  tablet   Oral   Take 50 mg by mouth daily.         . simvastatin (ZOCOR) 40 MG tablet   Oral   Take 40 mg by mouth at bedtime.           BP 105/67  Pulse 73  Temp(Src) 97.7 F (36.5 C) (Oral)  Resp 18  Ht 6\' 4"  (1.93 m)  Wt 268 lb (121.564 kg)  BMI 32.64 kg/m2  SpO2 98% Physical Exam Nursing notes and Vital Signs reviewed. Appearance:  Patient appears stated age, and in no acute distress.  Patient is obese (BMI 32.6)   Eyes:  Pupils are equal, round, and reactive to light and accomodation.  Extraocular movement is intact.  Conjunctivae are not inflamed  Ears:  Canals normal.  Tympanic membranes normal.  Nose:  Mildly congested turbinates.  Maxillary sinus tenderness is present.  Pharynx:  Normal Neck:  Supple.  No adenopathy Lungs:  Clear to auscultation.  Breath sounds are equal.  Heart:  Regular rate and rhythm without murmurs, rubs, or gallops.  Abdomen:  Nontender without masses or hepatosplenomegaly.  Bowel sounds are present.  No CVA or flank tenderness.  Extremities:  No edema.  No calf tenderness Skin:  No rash present.   ED Course  Procedures         MDM   1. Acute maxillary sinusitis   2. Acute upper respiratory infections of unspecified site    Begin amoxicillin.  Prescription written for Benzonatate [facility]) to take at bedtime for night-time cough.  Take plain Mucinex (1200 mg guaifenesin) twice daily for cough and congestion.  May add Sudafed as needed for sinus congestion.  Increase fluid intake, rest. Try warm salt water gargles for sore throat.  Stop all antihistamines for now, and other non-prescription cough/cold preparations. Followup with Family Doctor if not improved in one week.  Lattie Haw, MD 06/13/13 [phone]

## 2013-06-10 NOTE — ED Notes (Signed)
Pt c/o nasal congestion, cough, sinus pain and pressure x 11 days. Denies fever.

## 2013-06-22 DIAGNOSIS — H2510 Age-related nuclear cataract, unspecified eye: Secondary | ICD-10-CM | POA: Diagnosis not present

## 2013-07-16 DIAGNOSIS — E118 Type 2 diabetes mellitus with unspecified complications: Secondary | ICD-10-CM | POA: Diagnosis not present

## 2013-07-16 DIAGNOSIS — I10 Essential (primary) hypertension: Secondary | ICD-10-CM | POA: Diagnosis not present

## 2013-07-21 DIAGNOSIS — Z85828 Personal history of other malignant neoplasm of skin: Secondary | ICD-10-CM | POA: Diagnosis not present

## 2013-07-21 DIAGNOSIS — L570 Actinic keratosis: Secondary | ICD-10-CM | POA: Diagnosis not present

## 2013-07-22 DIAGNOSIS — E782 Mixed hyperlipidemia: Secondary | ICD-10-CM | POA: Diagnosis not present

## 2013-07-22 DIAGNOSIS — D696 Thrombocytopenia, unspecified: Secondary | ICD-10-CM | POA: Diagnosis not present

## 2013-07-22 DIAGNOSIS — I10 Essential (primary) hypertension: Secondary | ICD-10-CM | POA: Diagnosis not present

## 2013-07-22 DIAGNOSIS — J329 Chronic sinusitis, unspecified: Secondary | ICD-10-CM | POA: Diagnosis not present

## 2013-07-22 DIAGNOSIS — I2510 Atherosclerotic heart disease of native coronary artery without angina pectoris: Secondary | ICD-10-CM | POA: Diagnosis not present

## 2013-07-22 DIAGNOSIS — E118 Type 2 diabetes mellitus with unspecified complications: Secondary | ICD-10-CM | POA: Diagnosis not present

## 2013-07-22 DIAGNOSIS — Z23 Encounter for immunization: Secondary | ICD-10-CM | POA: Diagnosis not present

## 2013-07-27 DIAGNOSIS — G4733 Obstructive sleep apnea (adult) (pediatric): Secondary | ICD-10-CM | POA: Diagnosis not present

## 2013-11-30 ENCOUNTER — Other Ambulatory Visit: Payer: Self-pay | Admitting: Family Medicine

## 2013-11-30 DIAGNOSIS — R9389 Abnormal findings on diagnostic imaging of other specified body structures: Secondary | ICD-10-CM

## 2013-12-03 ENCOUNTER — Ambulatory Visit
Admission: RE | Admit: 2013-12-03 | Discharge: 2013-12-03 | Disposition: A | Discharge status: Home / Self Care | Payer: Medicare Other | Source: Ambulatory Visit | Attending: Family Medicine | Admitting: Family Medicine

## 2013-12-03 ENCOUNTER — Encounter (INDEPENDENT_AMBULATORY_CARE_PROVIDER_SITE_OTHER): Payer: Self-pay

## 2013-12-03 DIAGNOSIS — R9389 Abnormal findings on diagnostic imaging of other specified body structures: Secondary | ICD-10-CM

## 2013-12-03 DIAGNOSIS — J984 Other disorders of lung: Secondary | ICD-10-CM | POA: Diagnosis not present

## 2014-01-12 DIAGNOSIS — H2510 Age-related nuclear cataract, unspecified eye: Secondary | ICD-10-CM | POA: Diagnosis not present

## 2014-01-13 DIAGNOSIS — H02839 Dermatochalasis of unspecified eye, unspecified eyelid: Secondary | ICD-10-CM | POA: Diagnosis not present

## 2014-01-13 DIAGNOSIS — H43819 Vitreous degeneration, unspecified eye: Secondary | ICD-10-CM | POA: Diagnosis not present

## 2014-01-13 DIAGNOSIS — H2589 Other age-related cataract: Secondary | ICD-10-CM | POA: Diagnosis not present

## 2014-01-19 DIAGNOSIS — Z85828 Personal history of other malignant neoplasm of skin: Secondary | ICD-10-CM | POA: Diagnosis not present

## 2014-01-19 DIAGNOSIS — L570 Actinic keratosis: Secondary | ICD-10-CM | POA: Diagnosis not present

## 2014-01-20 DIAGNOSIS — E118 Type 2 diabetes mellitus with unspecified complications: Secondary | ICD-10-CM | POA: Diagnosis not present

## 2014-01-20 DIAGNOSIS — I10 Essential (primary) hypertension: Secondary | ICD-10-CM | POA: Diagnosis not present

## 2014-01-20 DIAGNOSIS — D649 Anemia, unspecified: Secondary | ICD-10-CM | POA: Diagnosis not present

## 2014-01-25 DIAGNOSIS — H25049 Posterior subcapsular polar age-related cataract, unspecified eye: Secondary | ICD-10-CM | POA: Diagnosis not present

## 2014-01-25 DIAGNOSIS — H2510 Age-related nuclear cataract, unspecified eye: Secondary | ICD-10-CM | POA: Diagnosis not present

## 2014-01-28 DIAGNOSIS — H59029 Cataract (lens) fragments in eye following cataract surgery, unspecified eye: Secondary | ICD-10-CM | POA: Diagnosis not present

## 2014-01-28 DIAGNOSIS — Y839 Surgical procedure, unspecified as the cause of abnormal reaction of the patient, or of later complication, without mention of misadventure at the time of the procedure: Secondary | ICD-10-CM | POA: Diagnosis not present

## 2014-03-01 DIAGNOSIS — L608 Other nail disorders: Secondary | ICD-10-CM | POA: Diagnosis not present

## 2014-03-01 DIAGNOSIS — L03039 Cellulitis of unspecified toe: Secondary | ICD-10-CM | POA: Diagnosis not present

## 2014-03-01 DIAGNOSIS — E119 Type 2 diabetes mellitus without complications: Secondary | ICD-10-CM | POA: Diagnosis not present

## 2014-03-10 DIAGNOSIS — I2510 Atherosclerotic heart disease of native coronary artery without angina pectoris: Secondary | ICD-10-CM | POA: Diagnosis not present

## 2014-03-10 DIAGNOSIS — E118 Type 2 diabetes mellitus with unspecified complications: Secondary | ICD-10-CM | POA: Diagnosis not present

## 2014-03-10 DIAGNOSIS — I959 Hypotension, unspecified: Secondary | ICD-10-CM | POA: Diagnosis not present

## 2014-03-10 DIAGNOSIS — M6530 Trigger finger, unspecified finger: Secondary | ICD-10-CM | POA: Diagnosis not present

## 2014-03-10 DIAGNOSIS — Z23 Encounter for immunization: Secondary | ICD-10-CM | POA: Diagnosis not present

## 2014-03-10 DIAGNOSIS — I10 Essential (primary) hypertension: Secondary | ICD-10-CM | POA: Diagnosis not present

## 2014-03-12 DIAGNOSIS — L02619 Cutaneous abscess of unspecified foot: Secondary | ICD-10-CM | POA: Diagnosis not present

## 2014-03-12 DIAGNOSIS — E119 Type 2 diabetes mellitus without complications: Secondary | ICD-10-CM | POA: Diagnosis not present

## 2014-03-12 DIAGNOSIS — L03039 Cellulitis of unspecified toe: Secondary | ICD-10-CM | POA: Diagnosis not present

## 2014-03-22 ENCOUNTER — Encounter: Payer: Self-pay | Admitting: Physician Assistant

## 2014-03-22 ENCOUNTER — Ambulatory Visit: Payer: Medicare Other | Admitting: Physician Assistant

## 2014-03-22 ENCOUNTER — Ambulatory Visit (INDEPENDENT_AMBULATORY_CARE_PROVIDER_SITE_OTHER): Payer: Medicare Other | Admitting: Physician Assistant

## 2014-03-22 VITALS — BP 126/78 | HR 69 | Ht 76.0 in | Wt 280.0 lb

## 2014-03-22 DIAGNOSIS — I959 Hypotension, unspecified: Secondary | ICD-10-CM | POA: Insufficient documentation

## 2014-03-22 DIAGNOSIS — I9589 Other hypotension: Secondary | ICD-10-CM | POA: Diagnosis not present

## 2014-03-22 DIAGNOSIS — N289 Disorder of kidney and ureter, unspecified: Secondary | ICD-10-CM | POA: Insufficient documentation

## 2014-03-22 DIAGNOSIS — R0600 Dyspnea, unspecified: Secondary | ICD-10-CM

## 2014-03-22 DIAGNOSIS — I10 Essential (primary) hypertension: Secondary | ICD-10-CM

## 2014-03-22 DIAGNOSIS — T50904A Poisoning by unspecified drugs, medicaments and biological substances, undetermined, initial encounter: Secondary | ICD-10-CM

## 2014-03-22 DIAGNOSIS — I25810 Atherosclerosis of coronary artery bypass graft(s) without angina pectoris: Secondary | ICD-10-CM | POA: Diagnosis not present

## 2014-03-22 DIAGNOSIS — R0609 Other forms of dyspnea: Secondary | ICD-10-CM | POA: Diagnosis not present

## 2014-03-22 DIAGNOSIS — R0989 Other specified symptoms and signs involving the circulatory and respiratory systems: Secondary | ICD-10-CM

## 2014-03-22 DIAGNOSIS — I952 Hypotension due to drugs: Secondary | ICD-10-CM

## 2014-03-22 NOTE — Progress Notes (Signed)
HPI: This is a 80 year old male patient of Dr. Tamala Julian who has coronary artery disease status post CABG in 2004, hypertension and hyperlipidemia.  Patient comes in today with blood pressure readings ranging from 76/46-127/58. He has become extremely dizzy and short of breath when his blood pressure drops. He's actually gained weight and went on a cruise and needs whatever he wants. He stopped his carvedilol, hydrochlorothiazide and losartan. His blood pressure remains stable. His blood pressure did jump up to 150 one day and he took one dose of carvedilol. He saw his primary care who did lead work which all was stable except for the creatinine was elevated at 1.48 and GFR 46. He denies any chest pain, palpitations, dyspnea, dyspnea on exertion.  Allergies  Allergen Reactions  . Ivp Dye [Iodinated Diagnostic Agents]      Current Outpatient Prescriptions  Medication Sig Dispense Refill  . acetaminophen (TYLENOL) 650 MG CR tablet Take 650 mg by mouth every 8 (eight) hours as needed for pain.      [name] alfuzosin (UROXATRAL) 10 MG 24 hr tablet Take 10 mg by mouth daily.       [name] aspirin 325 MG tablet Take 60 mg by mouth daily.       . carvedilol (COREG) 25 MG tablet 25 mg 3 (three) times daily - between meals and at bedtime.       . dorzolamide-timolol (COSOPT) 22.3-6.8 MG/ML ophthalmic solution       . hydrochlorothiazide (MICROZIDE) 12.5 MG capsule Take 12.5 mg by mouth daily.      [name] ibuprofen (ADVIL,MOTRIN) 100 MG tablet Take 200 mg by mouth daily. For pain      . loratadine (CLARITIN) 10 MG tablet Take 10 mg by mouth daily.      [name] losartan (COZAAR) 100 MG tablet Take 100 mg by mouth at bedtime.       . metFORMIN (GLUCOPHAGE) 500 MG tablet Take 500 mg by mouth 2 (two) times daily with a meal.      . NITROSTAT 0.4 MG SL tablet Place 0.4 mg under the tongue daily as needed. For chest pain      . sertraline (ZOLOFT) 50 MG tablet Take 50 mg by mouth daily.      . simvastatin (ZOCOR) 40 MG  tablet Take 40 mg by mouth at bedtime.       [name] alum & mag hydroxide-simeth (MAALOX PLUS) 400-400-40 MG/5ML suspension Take 10 mLs by mouth every 6 (six) hours as needed.      [name] amoxicillin (AMOXIL) 875 MG tablet Take 1 tablet (875 mg total) by mouth 2 (two) times daily.  20 tablet  0  . benzonatate (TESSALON) 200 MG capsule Take 1 capsule (200 mg total) by mouth at bedtime. Take as needed for cough  12 capsule  0   No current facility-administered medications for this visit.    Past Medical History  Diagnosis Date  . Diabetes mellitus without complication   . Hyperlipidemia   . Myocardial infarction 2005  . Hypertension     followed by Dr. Linard Millers   . Depression   . Anxiety   . Sleep apnea     CPAP almost q night   . Cold     common cold, reports that he has a lingering cold   . Headache(784.0)     sinus headaches   . Arthritis     degenerative spine     Past Surgical History  Procedure Laterality  Date  . Coronary artery bypass graft  2005    triple bypass  . Cardiac catheterization    . Eye surgery      lasik procedure   . Inguinal hernia repair  07/15/2012    Procedure: LAPAROSCOPIC INGUINAL HERNIA;  Surgeon: [name], MD,FACS;  Location: [facility];  Service: General;  Laterality: Right;  laparoscopic repair right inguinal hernia with mesh  . Insertion of mesh  07/15/2012    Procedure: INSERTION OF MESH;  Surgeon: [name], MD,FACS;  Location: [facility];  Service: General;  Laterality: Right;  right inguinal area    Family History  Problem Relation Age of Onset  . Cancer Sister     lung and breast  . Cancer Brother     lung    History   Social History  . Marital Status: Married    Spouse Name: N/A    Number of Children: N/A  . Years of Education: N/A   Occupational History  . Not on file.   Social History Main Topics  . Smoking status: Former Research scientist (life sciences)  . Smokeless tobacco: Former Systems developer    Quit date: 06/13/1984  . Alcohol Use: Yes     Comment:  occasional- "once per week" - one drink  . Drug Use: No  . Sexual Activity:    Other Topics Concern  . Not on file   Social History Narrative  . No narrative on file    ROS: See history of present illness otherwise negative  BP 126/78  Pulse 69  Ht 6\' 4"  (1.93 m)  Wt 280 lb (127.007 kg)  BMI 34.10 kg/m2  PHYSICAL EXAM: Obese, in no acute distress. Neck: No JVD, HJR, Bruit, or thyroid enlargement  Lungs: No tachypnea, clear without wheezing, rales, or rhonchi  Cardiovascular: RRR, PMI not displaced, heart sounds normal, no murmurs, gallops, bruit, thrill, or heave.  Abdomen: BS normal. Soft without organomegaly, masses, lesions or tenderness.  Extremities: without cyanosis, clubbing or edema. Good distal pulses bilateral  SKin: Warm, no lesions or rashes   Musculoskeletal: No deformities  Neuro: no focal signs   Wt Readings from Last 3 Encounters:  03/22/14 280 lb (127.007 kg)  06/10/13 268 lb (121.564 kg)  05/13/13 271 lb (122.925 kg)     EKG: Normal sinus rhythm with first degree AV block

## 2014-03-22 NOTE — Assessment & Plan Note (Signed)
History of CABG. Patient has no symptoms of angina.

## 2014-03-22 NOTE — Assessment & Plan Note (Signed)
Patient has had a recent drop in his blood pressure for unknown reasons says she's gained weight and this went on a cruise. All his blood pressure medications have been stopped and he is feeling better. His primary care wanted his LV function check. He also had some renal insufficiency on recent blood work. We'll order a 2-D echo for LV function. He will continue to monitor his blood pressures. Hold off on restarting anything at this time. He'll see Dr. Tamala Julian back in one month.

## 2014-03-22 NOTE — Patient Instructions (Signed)
Your physician recommends that you continue on your current medications as directed. Please refer to the Current Medication list given to you today.    Your physician has requested that you have an echocardiogram. Echocardiography is a painless test that uses sound waves to create images of your heart. It provides your doctor with information about the size and shape of your heart and how well your heart's chambers and valves are working. This procedure takes approximately one hour. There are no restrictions for this procedure    Your physician recommends that you schedule a follow-up appointment in: with DR [place]

## 2014-03-22 NOTE — Assessment & Plan Note (Signed)
Patient's creatinine was slightly elevated. Followup with Dr. Rex Kras and Dr. Tamala Julian.

## 2014-03-24 DIAGNOSIS — M6530 Trigger finger, unspecified finger: Secondary | ICD-10-CM | POA: Diagnosis not present

## 2014-03-25 ENCOUNTER — Ambulatory Visit (HOSPITAL_COMMUNITY): Payer: Medicare Other | Attending: Cardiology | Admitting: Radiology

## 2014-03-25 DIAGNOSIS — E785 Hyperlipidemia, unspecified: Secondary | ICD-10-CM | POA: Insufficient documentation

## 2014-03-25 DIAGNOSIS — R42 Dizziness and giddiness: Secondary | ICD-10-CM | POA: Diagnosis not present

## 2014-03-25 DIAGNOSIS — Z87891 Personal history of nicotine dependence: Secondary | ICD-10-CM | POA: Diagnosis not present

## 2014-03-25 DIAGNOSIS — E119 Type 2 diabetes mellitus without complications: Secondary | ICD-10-CM | POA: Diagnosis not present

## 2014-03-25 DIAGNOSIS — R0602 Shortness of breath: Secondary | ICD-10-CM | POA: Insufficient documentation

## 2014-03-25 DIAGNOSIS — I10 Essential (primary) hypertension: Secondary | ICD-10-CM | POA: Diagnosis not present

## 2014-03-25 NOTE — Progress Notes (Signed)
Echocardiogram performed.  

## 2014-03-29 ENCOUNTER — Encounter: Payer: Self-pay | Admitting: Physician Assistant

## 2014-04-14 DIAGNOSIS — R944 Abnormal results of kidney function studies: Secondary | ICD-10-CM | POA: Diagnosis not present

## 2014-04-21 DIAGNOSIS — M65341 Trigger finger, right ring finger: Secondary | ICD-10-CM | POA: Diagnosis not present

## 2014-05-10 ENCOUNTER — Ambulatory Visit (INDEPENDENT_AMBULATORY_CARE_PROVIDER_SITE_OTHER): Payer: Medicare Other | Admitting: Interventional Cardiology

## 2014-05-10 ENCOUNTER — Encounter: Payer: Self-pay | Admitting: Interventional Cardiology

## 2014-05-10 VITALS — BP 112/56 | HR 67 | Ht 75.0 in | Wt 277.2 lb

## 2014-05-10 DIAGNOSIS — R031 Nonspecific low blood-pressure reading: Secondary | ICD-10-CM | POA: Diagnosis not present

## 2014-05-10 DIAGNOSIS — I10 Essential (primary) hypertension: Secondary | ICD-10-CM | POA: Diagnosis not present

## 2014-05-10 DIAGNOSIS — I25810 Atherosclerosis of coronary artery bypass graft(s) without angina pectoris: Secondary | ICD-10-CM | POA: Diagnosis not present

## 2014-05-10 DIAGNOSIS — E785 Hyperlipidemia, unspecified: Secondary | ICD-10-CM | POA: Diagnosis not present

## 2014-05-10 MED ORDER — LOSARTAN POTASSIUM 50 MG PO TABS: 50.0000 mg | ORAL_TABLET | Freq: Every day | ORAL | Status: AC

## 2014-05-10 MED ORDER — CARVEDILOL 12.5 MG PO TABS: 12.5000 mg | ORAL_TABLET | Freq: Two times a day (BID) | ORAL | Status: DC

## 2014-05-10 NOTE — Patient Instructions (Signed)
Your physician has recommended you make the following change in your medication:  1) STOP Hctz 2) REDUCE Losartan to 50mg  daily 3) REDUCE Carvedilol 12.5mg  twice daily  Your physician recommends that you return for lab work on 06/09/14 (Bmet)  Check your blood pressure 3 times a week for the next month. Bring your reading with you at your next office visit Call the office if your bp is consistently below 100/80.  You have a follow up appointment schedule on 06/09/14 @ 10am

## 2014-05-10 NOTE — Progress Notes (Signed)
Patient ID: Jacob [name], male   DOB: 1933-04-02, 80 y.o.   MRN: [id number]    [address], [address] [place], [place]  [address] Phone: [phone] Fax:  [phone]  Date:  05/10/2014   ID:  [name], DOB 1932/09/06, MRN [id number]  PCP:  Gennette Pac, MD   ASSESSMENT:  1. Frequent hypotension based upon the patient's home blood pressure recordings. The patient is been taking his medications intermittently 2. Chronic kidney disease, stage III based upon creatinine of 1.48 in September at Dr. Lennette Bihari [facility] 3. Coronary artery disease, status post bypass surgery, asymptomatic 4. Diabetes mellitus  PLAN:  1. Discontinue hydrochlorothiazide 2. Decrease losartan to 50 mg daily 3. Decrease carvedilol to 12.5 mg twice a day 4. Clinical follow-up with me in 4 weeks with a basic metabolic panel 5. Continue to measure blood pressure at least 3 times per week. Call if blood pressures range less than 100/80 mmHg consistently.   SUBJECTIVE: Jacob [name] is a 80 y.o. male who frequently feels lightheaded. Blood pressure recordings during those times are late with systolic blood pressures less than 90 mmHg. He denies angina. Since September 10 he has not taken his medication on any regular schedule, but has self medicated taking various combinations of his medications based upon bad days a.m. Blood pressure recording. Many days he goes without any medication at all. With the pressure as low he feels lightheaded, weak, and diaphoretic. He denies orthopnea, PND, dyspnea, syncope, and edema.   Wt Readings from Last 3 Encounters:  03/22/14 280 lb (127.007 kg)  06/10/13 268 lb (121.564 kg)  05/13/13 271 lb (122.925 kg)     Past Medical History  Diagnosis Date  . Diabetes mellitus without complication   . Hyperlipidemia   . Myocardial infarction 2005  . Hypertension     followed by Dr. Linard Millers   . Depression   . Anxiety   . Sleep apnea     CPAP almost q night   . Cold    common cold, reports that he has a lingering cold   . Headache(784.0)     sinus headaches   . Arthritis     degenerative spine     Current Outpatient Prescriptions  Medication Sig Dispense Refill  . acetaminophen (TYLENOL) 650 MG CR tablet Take 650 mg by mouth every 8 (eight) hours as needed for pain.    [name] alfuzosin (UROXATRAL) 10 MG 24 hr tablet Take 10 mg by mouth daily.     [name] alum & mag hydroxide-simeth (MAALOX PLUS) 400-400-40 MG/5ML suspension Take 10 mLs by mouth every 6 (six) hours as needed.    [name] aspirin 81 MG tablet Take 81 mg by mouth daily.    . benzonatate (TESSALON) 200 MG capsule Take 1 capsule (200 mg total) by mouth at bedtime. Take as needed for cough 12 capsule 0  . carvedilol (COREG) 25 MG tablet Take 25 mg by mouth 2 (two) times daily with a meal.    . dorzolamide-timolol (COSOPT) 22.3-6.8 MG/ML ophthalmic solution     . hydrochlorothiazide (MICROZIDE) 12.5 MG capsule Take 12.5 mg by mouth daily.    [name] loratadine (CLARITIN) 10 MG tablet Take 10 mg by mouth daily as needed for allergies.     [name] losartan (COZAAR) 100 MG tablet Take 100 mg by mouth at bedtime.     . metFORMIN (GLUCOPHAGE) 500 MG tablet Take 500 mg by mouth 2 (two) times daily with a meal.    .  NITROSTAT 0.4 MG SL tablet Place 0.4 mg under the tongue daily as needed. For chest pain    . sertraline (ZOLOFT) 50 MG tablet Take 50 mg by mouth daily.    . simvastatin (ZOCOR) 40 MG tablet Take 40 mg by mouth at bedtime.      No current facility-administered medications for this visit.    Allergies:    Allergies  Allergen Reactions  . Ivp Dye [Iodinated Diagnostic Agents] Hives    Hives     Social History:  The patient  reports that he has quit smoking. He quit smokeless tobacco use about 29 years ago. He reports that he drinks alcohol. He reports that he does not use illicit drugs.   ROS:  Please see the history of present illness.   He denies supplement use. There is no palpitation or claudication. He  denies neurological symptoms. He has not had syncope.   All other systems reviewed and negative.   OBJECTIVE: VS:  There were no vitals taken for this visit. Well nourished, well developed, in no acute distress, obese but healthy appearing HEENT: normal Neck: JVD flat with the patient lying at 30. Carotid bruit absent  Cardiac:  normal S1, S2; RRR; no murmur Lungs:  clear to auscultation bilaterally, no wheezing, rhonchi or rales Abd: soft, nontender, no hepatomegaly Ext: Edema none. Pulses 2+ Skin: warm and dry Neuro:  CNs 2-12 intact, no focal abnormalities noted  EKG:  Not performed  September bloodwork and Dr. Hulan Fess demonstrated a creatinine of 1.48       Signed, Illene Labrador III, MD 05/10/2014 11:06 AM

## 2014-05-21 ENCOUNTER — Encounter: Payer: Self-pay | Admitting: Interventional Cardiology

## 2014-05-31 DIAGNOSIS — L602 Onychogryphosis: Secondary | ICD-10-CM | POA: Diagnosis not present

## 2014-05-31 DIAGNOSIS — L03032 Cellulitis of left toe: Secondary | ICD-10-CM | POA: Diagnosis not present

## 2014-05-31 DIAGNOSIS — M79675 Pain in left toe(s): Secondary | ICD-10-CM | POA: Diagnosis not present

## 2014-06-02 DIAGNOSIS — H2512 Age-related nuclear cataract, left eye: Secondary | ICD-10-CM | POA: Diagnosis not present

## 2014-06-09 ENCOUNTER — Ambulatory Visit: Payer: Medicare Other | Admitting: Interventional Cardiology

## 2014-06-17 DIAGNOSIS — M65341 Trigger finger, right ring finger: Secondary | ICD-10-CM | POA: Diagnosis not present

## 2014-06-17 DIAGNOSIS — L03032 Cellulitis of left toe: Secondary | ICD-10-CM | POA: Diagnosis not present

## 2014-07-12 ENCOUNTER — Ambulatory Visit (INDEPENDENT_AMBULATORY_CARE_PROVIDER_SITE_OTHER): Payer: Medicare Other | Admitting: Interventional Cardiology

## 2014-07-12 ENCOUNTER — Encounter: Payer: Self-pay | Admitting: Interventional Cardiology

## 2014-07-12 ENCOUNTER — Other Ambulatory Visit: Payer: Self-pay | Admitting: *Deleted

## 2014-07-12 VITALS — BP 120/74 | HR 73 | Ht 76.0 in | Wt 282.0 lb

## 2014-07-12 DIAGNOSIS — I10 Essential (primary) hypertension: Secondary | ICD-10-CM | POA: Diagnosis not present

## 2014-07-12 DIAGNOSIS — I25810 Atherosclerosis of coronary artery bypass graft(s) without angina pectoris: Secondary | ICD-10-CM | POA: Diagnosis not present

## 2014-07-12 DIAGNOSIS — E785 Hyperlipidemia, unspecified: Secondary | ICD-10-CM | POA: Diagnosis not present

## 2014-07-12 DIAGNOSIS — N289 Disorder of kidney and ureter, unspecified: Secondary | ICD-10-CM

## 2014-07-12 NOTE — Patient Instructions (Signed)
Your physician recommends that you continue on your current medications as directed. Please refer to the Current Medication list given to you today.  Your physician wants you to follow-up in: 1 year with Dr.Smith You will receive a reminder letter in the mail two months in advance. If you don't receive a letter, please call our office to schedule the follow-up appointment.  

## 2014-07-12 NOTE — Progress Notes (Signed)
Patient ID: Jacob [name], male   DOB: [birth year], 81 y.o.   MRN: [id number]    [address], [place]  [address] Phone: [phone] Fax:  [phone]  Date:  07/12/2014   ID:  Jacob [name], DOB 06-03-1933, MRN [id number]  PCP:  Gennette Pac, MD   ASSESSMENT:  1. Essential hypertension, stable 2. Hyperlipidemia, on therapy 3. CAD the with prior coronary bypass grafting, asymptomatic  PLAN:  1. Late last year he had hypotension and medication adjustments were made however blood pressure began to increase and he is now back on his previous standard medication regimen with good blood pressure control documented by multiple recordings from home over the last month. He is asymptomatic with reference to side effects. 2. Total follow-up with me in one year   SUBJECTIVE: Jacob [name] is a 81 y.o. male who is doing well. We will have an hypotension and dizziness late in 2015. We made transient reduction in medication doses. He is now back up to his standard blood pressure medication regimen with good blood pressure control. He has documented his blood pressures nicely over the past month. For the most part they range less than 140/85. Most blood pressures are less than 262 systolic.   Wt Readings from Last 3 Encounters:  07/12/14 282 lb (127.914 kg)  05/10/14 277 lb 3.2 oz (125.737 kg)  03/22/14 280 lb (127.007 kg)     Past Medical History  Diagnosis Date  . Diabetes mellitus without complication   . Hyperlipidemia   . Myocardial infarction 2005  . Hypertension     followed by Dr. Linard Millers   . Depression   . Anxiety   . Sleep apnea     CPAP almost q night   . Cold     common cold, reports that he has a lingering cold   . Headache(784.0)     sinus headaches   . Arthritis     degenerative spine     Current Outpatient Prescriptions  Medication Sig Dispense Refill  . acetaminophen (TYLENOL) 650 MG CR tablet Take 500 mg by mouth every evening.     [name]  alfuzosin (UROXATRAL) 10 MG 24 hr tablet Take 10 mg by mouth daily.     [name] aspirin 81 MG tablet Take 60 mg by mouth daily.     . carvedilol (COREG) 12.5 MG tablet Take 1 tablet (12.5 mg total) by mouth 2 (two) times daily with a meal.    . Cetirizine HCl 10 MG CAPS Take 10 mg by mouth daily.    . hydrochlorothiazide (MICROZIDE) 12.5 MG capsule Take 12.5 mg by mouth daily.     [name] losartan (COZAAR) 50 MG tablet Take 1 tablet (50 mg total) by mouth at bedtime.    . metFORMIN (GLUCOPHAGE) 500 MG tablet Take 500 mg by mouth 2 (two) times daily with a meal.    . NITROSTAT 0.4 MG SL tablet Place 0.4 mg under the tongue daily as needed. For chest pain    . sertraline (ZOLOFT) 50 MG tablet Take 50 mg by mouth daily.    . simvastatin (ZOCOR) 40 MG tablet Take 40 mg by mouth at bedtime.      No current facility-administered medications for this visit.    Allergies:    Allergies  Allergen Reactions  . Ivp Dye [Iodinated Diagnostic Agents] Hives    Hives     Social History:  The patient  reports that he has quit smoking. He  quit smokeless tobacco use about 30 years ago. He reports that he drinks alcohol. He reports that he does not use illicit drugs.   ROS:  Please see the history of present illness.   No syncope. Occasional orthostatic dizziness if he has been squatting or sitting for prolonged periods.   All other systems reviewed and negative.   OBJECTIVE: VS:  BP 120/74 mmHg  Pulse 73  Ht 6\' 4"  (1.93 m)  Wt 282 lb (127.914 kg)  BMI 34.34 kg/m2 Well nourished, well developed, in no acute distress, obese HEENT: normal Neck: JVD flat. Carotid bruit absent  Cardiac:  normal S1, S2; RRR; no murmur Lungs:  clear to auscultation bilaterally, no wheezing, rhonchi or rales Abd: soft, nontender, no hepatomegaly Ext: Edema absent. Pulses 2+ Skin: warm and dry Neuro:  CNs 2-12 intact, no focal abnormalities noted  EKG:  Not performed       Signed, Illene Labrador III, MD 07/12/2014 2:08 PM

## 2014-07-26 DIAGNOSIS — G4733 Obstructive sleep apnea (adult) (pediatric): Secondary | ICD-10-CM | POA: Diagnosis not present

## 2014-08-03 DIAGNOSIS — Z85828 Personal history of other malignant neoplasm of skin: Secondary | ICD-10-CM | POA: Diagnosis not present

## 2014-08-03 DIAGNOSIS — L570 Actinic keratosis: Secondary | ICD-10-CM | POA: Diagnosis not present

## 2014-08-03 DIAGNOSIS — Z08 Encounter for follow-up examination after completed treatment for malignant neoplasm: Secondary | ICD-10-CM | POA: Diagnosis not present

## 2014-08-05 DIAGNOSIS — E119 Type 2 diabetes mellitus without complications: Secondary | ICD-10-CM | POA: Diagnosis not present

## 2014-08-05 DIAGNOSIS — D582 Other hemoglobinopathies: Secondary | ICD-10-CM | POA: Diagnosis not present

## 2014-08-18 ENCOUNTER — Other Ambulatory Visit: Payer: Self-pay | Admitting: Family Medicine

## 2014-08-18 DIAGNOSIS — R911 Solitary pulmonary nodule: Secondary | ICD-10-CM

## 2014-08-18 DIAGNOSIS — D649 Anemia, unspecified: Secondary | ICD-10-CM | POA: Diagnosis not present

## 2014-08-18 DIAGNOSIS — E118 Type 2 diabetes mellitus with unspecified complications: Secondary | ICD-10-CM | POA: Diagnosis not present

## 2014-08-18 DIAGNOSIS — I25119 Atherosclerotic heart disease of native coronary artery with unspecified angina pectoris: Secondary | ICD-10-CM | POA: Diagnosis not present

## 2014-08-18 DIAGNOSIS — D696 Thrombocytopenia, unspecified: Secondary | ICD-10-CM | POA: Diagnosis not present

## 2014-08-18 DIAGNOSIS — R251 Tremor, unspecified: Secondary | ICD-10-CM | POA: Diagnosis not present

## 2014-08-18 DIAGNOSIS — R202 Paresthesia of skin: Secondary | ICD-10-CM | POA: Diagnosis not present

## 2014-08-18 DIAGNOSIS — I10 Essential (primary) hypertension: Secondary | ICD-10-CM | POA: Diagnosis not present

## 2014-08-18 DIAGNOSIS — E782 Mixed hyperlipidemia: Secondary | ICD-10-CM | POA: Diagnosis not present

## 2014-09-09 ENCOUNTER — Emergency Department (INDEPENDENT_AMBULATORY_CARE_PROVIDER_SITE_OTHER)
Admission: EM | Admit: 2014-09-09 | Discharge: 2014-09-09 | Disposition: A | Discharge status: Home / Self Care | Payer: Medicare Other | Source: Home / Self Care | Attending: Emergency Medicine | Admitting: Emergency Medicine

## 2014-09-09 ENCOUNTER — Emergency Department (INDEPENDENT_AMBULATORY_CARE_PROVIDER_SITE_OTHER): Payer: Medicare Other

## 2014-09-09 ENCOUNTER — Encounter: Payer: Self-pay | Admitting: Emergency Medicine

## 2014-09-09 DIAGNOSIS — M79644 Pain in right finger(s): Secondary | ICD-10-CM

## 2014-09-09 DIAGNOSIS — L03011 Cellulitis of right finger: Secondary | ICD-10-CM

## 2014-09-09 DIAGNOSIS — X58XXXA Exposure to other specified factors, initial encounter: Secondary | ICD-10-CM

## 2014-09-09 DIAGNOSIS — S60450A Superficial foreign body of right index finger, initial encounter: Secondary | ICD-10-CM

## 2014-09-09 DIAGNOSIS — M7989 Other specified soft tissue disorders: Secondary | ICD-10-CM | POA: Diagnosis not present

## 2014-09-09 MED ORDER — DOXYCYCLINE HYCLATE 100 MG PO CAPS: 100.0000 mg | ORAL_CAPSULE | Freq: Two times a day (BID) | ORAL | Status: DC

## 2014-09-09 MED ORDER — AMOXICILLIN-POT CLAVULANATE 875-125 MG PO TABS: 1.0000 | ORAL_TABLET | Freq: Two times a day (BID) | ORAL | Status: DC

## 2014-09-09 NOTE — ED Provider Notes (Signed)
CSN: [id number]     Arrival date & time 09/09/14  1052 History   None    Chief Complaint  Patient presents with  . paronychia    (Consider location/radiation/quality/duration/timing/severity/associated sxs/prior Treatment) HPI Recalls no specific injury. Complains of moderate sharp and dull pain right distal index finger 1 week. States it feels warm to touch. No bleeding or drainage. Denies systemic symptoms such as fever or chills syncope nausea or vomiting. Denies chest pain or shortness of breath. No URI symptoms other than some mild chronic sinus allergy symptoms. Denies abdominal pain or nausea or vomiting.  He also mentions that he had remote injury of this same right index finger many years ago and the right DIP joint area is chronically swollen for years from this. Past Medical History  Diagnosis Date  . Diabetes mellitus without complication   . Hyperlipidemia   . Myocardial infarction 2005  . Hypertension     followed by Dr. Linard Millers   . Depression   . Anxiety   . Sleep apnea     CPAP almost q night   . Cold     common cold, reports that he has a lingering cold   . Headache(784.0)     sinus headaches   . Arthritis     degenerative spine    Past Surgical History  Procedure Laterality Date  . Coronary artery bypass graft  2005    triple bypass  . Cardiac catheterization    . Eye surgery      lasik procedure   . Inguinal hernia repair  07/15/2012    Procedure: LAPAROSCOPIC INGUINAL HERNIA;  Surgeon: [name], MD,FACS;  Location: [facility];  Service: General;  Laterality: Right;  laparoscopic repair right inguinal hernia with mesh  . Insertion of mesh  07/15/2012    Procedure: INSERTION OF MESH;  Surgeon: [name], MD,FACS;  Location: [facility];  Service: General;  Laterality: Right;  right inguinal area   Family History  Problem Relation Age of Onset  . Cancer Sister     lung and breast  . Cancer Brother     lung   History  Substance Use Topics  . Smoking  status: Former Research scientist (life sciences)  . Smokeless tobacco: Former Systems developer    Quit date: 06/13/1984  . Alcohol Use: Yes     Comment: occasional- "once per week" - one drink    Review of Systems  All other systems reviewed and are negative.   Allergies  Ivp dye  Home Medications   Prior to Admission medications   Medication Sig Start Date End Date Taking? Authorizing Provider  acetaminophen (TYLENOL) 650 MG CR tablet Take 500 mg by mouth every evening.    Yes Historical Provider, MD  alfuzosin (UROXATRAL) 10 MG 24 hr tablet Take 10 mg by mouth daily.  06/19/12  Yes Historical Provider, MD  aspirin 81 MG tablet Take 60 mg by mouth daily.    Yes Historical Provider, MD  carvedilol (COREG) 12.5 MG tablet Take 1 tablet (12.5 mg total) by mouth 2 (two) times daily with a meal. 05/10/14  Yes Belva Crome, MD  hydrochlorothiazide (MICROZIDE) 12.5 MG capsule Take 12.5 mg by mouth daily.  07/08/14  Yes Historical Provider, MD  losartan (COZAAR) 50 MG tablet Take 1 tablet (50 mg total) by mouth at bedtime. 05/10/14  Yes Belva Crome, MD  metFORMIN (GLUCOPHAGE) 500 MG tablet Take 500 mg by mouth 2 (two) times daily with a meal.  Yes Historical Provider, MD  sertraline (ZOLOFT) 50 MG tablet Take 50 mg by mouth daily.   Yes Historical Provider, MD  simvastatin (ZOCOR) 40 MG tablet Take 40 mg by mouth at bedtime.    Yes Historical Provider, MD  amoxicillin-clavulanate (AUGMENTIN) 875-125 MG per tablet Take 1 tablet by mouth 2 (two) times daily. For 10 days. Take with food. 09/09/14   Jacqulyn Cane, MD  Cetirizine HCl 10 MG CAPS Take 10 mg by mouth daily.    Historical Provider, MD  doxycycline (VIBRAMYCIN) 100 MG capsule Take 1 capsule (100 mg total) by mouth 2 (two) times daily. 09/09/14   Jacqulyn Cane, MD  NITROSTAT 0.4 MG SL tablet Place 0.4 mg under the tongue daily as needed. For chest pain 05/12/12   Historical Provider, MD   BP 107/68 mmHg  Pulse 66  Temp(Src) 97.5 F (36.4 C) (Oral)  Ht 6\' 4"  (1.93 m)  Wt 285  lb (129.275 kg)  BMI 34.71 kg/m2  SpO2 96% Physical Exam  Constitutional: He is oriented to person, place, and time. He appears well-developed and well-nourished. No distress.  HENT:  Head: Normocephalic and atraumatic.  Eyes: Conjunctivae and EOM are normal. Pupils are equal, round, and reactive to light. No scleral icterus.  Neck: Normal range of motion.  Cardiovascular: Normal rate.   Pulmonary/Chest: Effort normal.  Abdominal: He exhibits no distension.  Musculoskeletal: Normal range of motion.       Hands: Right index finger: Distal phalanx: red indurated swollen and tender. No fluctuance or drainage. No red streaks. Range of motion decreased DIP. No tenderness or redness proximal to the DIP. Finger-nail slight depressed irregularity at the cuticle. No drainage or other fingernail abnormality. No foreign body seen or palpated.  Neurological: He is alert and oriented to person, place, and time.  Skin: Skin is warm.  Psychiatric: He has a normal mood and affect.  Nursing note and vitals reviewed.   ED Course  Procedures (including critical care time) Labs Review Labs Reviewed - No data to display 11:25 AM x-ray right index finger ordered Imaging Review FINDINGS: Some generalized swelling of the second digit is noted most noted about the distal interphalangeal joint. Degenerative osteophytic changes are noted in the DIP joint. No bony erosions are seen. No changes of osteomyelitis are noted. Three tiny densities are noted within the anterior soft tissues adjacent to the distal phalanx. These may represent acute foreign bodies and clinical correlation is recommended.  IMPRESSION: No acute fracture is noted. Generalized soft tissue swelling is noted as well as 3 small radiopaque densities in the anterior distal soft tissues likely representing small foreign bodies. Clinical correlation is recommended.  Electronically Signed  By: Inez Catalina M.D.  On: 09/09/2014  12:26  MDM   1. Cellulitis of right index finger   2. Pain of finger of right hand   discussed x-ray findings with patient and wife at length. No evidence of acute fracture or dislocation.  Cellulitis right distal index finger No evidence of fluctuance or abscess. There are 3 tiny radiopaque density anterior distal soft tissues, which do not directly correlate with the area of tenderness and swelling.--These are likely not acute. We'll treat conservatively with antibiotics and warm soaks. Because of his risk factor of diabetes, aggressive coverage with both Augmentin and Doxycycline, for both aerobic coverage ( including staph/MRSA) as well as anaerobic coverage.  Other advice given.  Follow-up with hand surgeon if not improved in a week, but sooner if any new symptoms or  worsening or any red flags.--if any acute red flags, go to emergency room immediately. The patient and wife voiced understanding and agreement.    Jacqulyn Cane, MD 09/11/14 1726

## 2014-09-09 NOTE — ED Notes (Signed)
Rt index finger red and swollen x 1 week

## 2014-09-09 NOTE — ED Notes (Signed)
Rt index finger painful and swollen x 1 week

## 2014-10-18 ENCOUNTER — Ambulatory Visit (INDEPENDENT_AMBULATORY_CARE_PROVIDER_SITE_OTHER): Payer: Medicare Other | Admitting: Neurology

## 2014-10-18 ENCOUNTER — Encounter: Payer: Self-pay | Admitting: Neurology

## 2014-10-18 VITALS — BP 119/75 | HR 68 | Ht 76.0 in | Wt 287.2 lb

## 2014-10-18 DIAGNOSIS — G250 Essential tremor: Secondary | ICD-10-CM | POA: Diagnosis not present

## 2014-10-18 DIAGNOSIS — E1342 Other specified diabetes mellitus with diabetic polyneuropathy: Secondary | ICD-10-CM | POA: Diagnosis not present

## 2014-10-18 DIAGNOSIS — G629 Polyneuropathy, unspecified: Secondary | ICD-10-CM

## 2014-10-18 DIAGNOSIS — E1142 Type 2 diabetes mellitus with diabetic polyneuropathy: Secondary | ICD-10-CM

## 2014-10-18 NOTE — Progress Notes (Signed)
PATIENT: Jacob [name] DOB: 1932/12/18  HISTORICAL  Jacob [name] is 81 yo RH male, alone at visit, he is referred by his primary care physician Hulan Fess for evaluation of bilateral hands tremor.  He has history of hypertension, hyperlipidemia, type 2 diabetes, coronary artery disease,  Since 2014, he noticed intermittent bilateral hands tremor, right worse than left, especially when he tried to use utensils, writing, he noticed difficulty holding his right hand steady,  He denies gait difficulty, no loss sense of smell, no REM sleep disorder,  He still driving, active at home  REVIEW OF SYSTEMS: Full 14 system review of systems performed and notable only for loss of vision, snoring, urination problems, achy muscles, allergy, headaches, decreased energy.  ALLERGIES: Allergies  Allergen Reactions  . Ivp Dye [Iodinated Diagnostic Agents] Hives    Hives     HOME MEDICATIONS: Current Outpatient Prescriptions  Medication Sig Dispense Refill  . acetaminophen (TYLENOL) 650 MG CR tablet Take 500 mg by mouth every evening.     [name] alfuzosin (UROXATRAL) 10 MG 24 hr tablet Take 10 mg by mouth daily.     [name] aspirin 81 MG tablet Take 60 mg by mouth daily.     . carvedilol (COREG) 12.5 MG tablet Take 1 tablet (12.5 mg total) by mouth 2 (two) times daily with a meal.    . Cetirizine HCl 10 MG CAPS Take 10 mg by mouth daily.    . hydrochlorothiazide (MICROZIDE) 12.5 MG capsule Take 12.5 mg by mouth daily.     [name] losartan (COZAAR) 50 MG tablet Take 1 tablet (50 mg total) by mouth at bedtime.    . metFORMIN (GLUCOPHAGE) 500 MG tablet Take 500 mg by mouth 2 (two) times daily with a meal.    . NITROSTAT 0.4 MG SL tablet Place 0.4 mg under the tongue daily as needed. For chest pain    . sertraline (ZOLOFT) 50 MG tablet Take 50 mg by mouth daily.    . simvastatin (ZOCOR) 40 MG tablet Take 40 mg by mouth at bedtime.        PAST MEDICAL HISTORY: Past Medical History  Diagnosis Date  . Diabetes  mellitus without complication   . Hyperlipidemia   . Myocardial infarction 2005  . Hypertension     followed by Dr. Linard Millers   . Depression   . Anxiety   . Sleep apnea     CPAP almost q night   . Cold     common cold, reports that he has a lingering cold   . Headache(784.0)     sinus headaches   . Arthritis     degenerative spine     PAST SURGICAL HISTORY: Past Surgical History  Procedure Laterality Date  . Coronary artery bypass graft  2005    triple bypass  . Cardiac catheterization    . Eye surgery      lasik procedure   . Inguinal hernia repair  07/15/2012    Procedure: LAPAROSCOPIC INGUINAL HERNIA;  Surgeon: [name], MD,FACS;  Location: [place];  Service: General;  Laterality: Right;  laparoscopic repair right inguinal hernia with mesh  . Insertion of mesh  07/15/2012    Procedure: INSERTION OF MESH;  Surgeon: [name], MD,FACS;  Location: [place];  Service: General;  Laterality: Right;  right inguinal area    FAMILY HISTORY: Family History  Problem Relation Age of Onset  . Cancer Sister     lung and breast  .  Cancer Brother     lung    SOCIAL HISTORY:  History   Social History  . Marital Status: Married    Spouse Name: N/A  . Number of Children: 2  . Years of Education: N/A   Occupational History  . Not on file.   Social History Main Topics  . Smoking status: Former Research scientist (life sciences)  . Smokeless tobacco: Former Systems developer    Quit date: 06/13/1984  . Alcohol Use: Yes     Comment: occasional- "once per week" - one drink  . Drug Use: No  . Sexual Activity: Not on file   Other Topics Concern  . Not on file   Social History Narrative     PHYSICAL EXAM   Filed Vitals:   10/18/14 1000  BP: 119/75  Pulse: 68  Height: 6\' 4"  (1.93 m)  Weight: 287 lb 3.2 oz (130.273 kg)    Not recorded      Body mass index is 34.97 kg/(m^2).  PHYSICAL EXAMNIATION:  Gen: NAD, conversant, well nourised, obese, well groomed                     Cardiovascular:  Regular rate rhythm, no peripheral edema, warm, nontender. Eyes: Conjunctivae clear without exudates or hemorrhage Neck: Supple, no carotid bruise. Pulmonary: Clear to auscultation bilaterally   NEUROLOGICAL EXAM:  MENTAL STATUS: Speech:    Speech is normal; fluent and spontaneous with normal comprehension.  Cognition:    The patient is oriented to person, place, and time;     recent and remote memory intact;     language fluent;     normal attention, concentration,     fund of knowledge.  CRANIAL NERVES: CN II: Visual fields are full to confrontation. Fundoscopic exam is normal with sharp discs and no vascular changes. Venous pulsations are present bilaterally. Pupils are 4 mm and briskly reactive to light. Visual acuity is 20/20 bilaterally. CN III, IV, VI: extraocular movement are normal. No ptosis. CN V: Facial sensation is intact to pinprick in all 3 divisions bilaterally. Corneal responses are intact.  CN VII: Face is symmetric with normal eye closure and smile. CN VIII: Hearing is normal to rubbing fingers CN IX, X: Palate elevates symmetrically. Phonation is normal. CN XI: Head turning and shoulder shrug are intact CN XII: Tongue is midline with normal movements and no atrophy.  MOTOR:  Mild bilateral hands posturing tremor, Muscle bulk and tone are normal. Muscle strength is normal.   Shoulder abduction Shoulder external rotation Elbow flexion Elbow extension Wrist flexion Wrist extension Finger abduction Hip flexion Knee flexion Knee extension Ankle dorsi flexion Ankle plantar flexion  R 5 5 5 5 5 5 5 5 5 5 5 5   L 5 5 5 5 5 5 5 5 5 5 5 5     REFLEXES: Reflexes are 2+ and symmetric at the biceps, triceps, knees, and ankles. Plantar responses are flexor.  SENSORY:  length dependent decreased light touch, pinprick, position sense, and vibration sensation at toes.  COORDINATION: Rapid alternating movements and fine finger movements are intact. There is no dysmetria on  finger-to-nose and heel-knee-shin. There are no abnormal or extraneous movements.   GAIT/STANCE: Posture is normal. Gait is steady with normal steps, base, arm swing, and turning. Heel and toe walking are normal. Tandem gait is normal.  Romberg is absent.   DIAGNOSTIC DATA (LABS, IMAGING, TESTING) - I reviewed patient records, labs, notes, testing and imaging myself where available.  Lab Results  Component Value Date   WBC 7.9 07/14/2012   HGB 13.2 07/14/2012   HCT 39.0 07/14/2012   MCV 83.3 07/14/2012   PLT 130* 07/14/2012      Component Value Date/Time   NA 141 07/14/2012 1520   K 4.2 07/14/2012 1520   CL 103 07/14/2012 1520   CO2 28 07/14/2012 1520   GLUCOSE 100* 07/14/2012 1520   BUN 24* 07/14/2012 1520   CREATININE 1.08 07/14/2012 1520   CALCIUM 9.9 07/14/2012 1520   PROT 5.9* 02/27/2007 1859   ALBUMIN 3.0* 02/27/2007 1859   AST 43* 02/27/2007 1859   ALT 58* 02/27/2007 1859   ALKPHOS 66 02/27/2007 1859   BILITOT 0.8 02/27/2007 1859   GFRNONAA 64* 07/14/2012 1520   GFRAA 74* 07/14/2012 1520   No results found for: CHOL, HDL, LDLCALC, LDLDIRECT, TRIG, CHOLHDL Lab Results  Component Value Date   HGBA1C * 02/28/2007    7.9 (NOTE)   The ADA recommends the following therapeutic goals for glycemic   control related to Hgb A1C measurement:   Goal of Therapy:   < 7.0% Hgb A1C   Action Suggested:  > 8.0% Hgb A1C   Ref:  Diabetes Care, 51, Suppl. 1, 1999   No results found for: VITAMINB12 No results found for: TSH    ASSESSMENT AND PLAN  Jacob [name] is a 81 y.o. male  right-handed Caucasian male, with past medical history of hypertension, diabetes, mild diabetic peripheral neuropathy, coronary artery disease, presenting   bilateral hands posturing tremor since 2014, most consistent with essential tremor,  Recent laboratory evaluation revealed, normal ferritin, CBC, TSH,     Marcial Pacas, M.D. Ph.D.  [facility] [address], [place], [place] [address] Ph: [phone] Fax: [phone]

## 2014-10-22 DIAGNOSIS — I10 Essential (primary) hypertension: Secondary | ICD-10-CM | POA: Diagnosis not present

## 2014-11-03 DIAGNOSIS — H527 Unspecified disorder of refraction: Secondary | ICD-10-CM | POA: Diagnosis not present

## 2014-11-03 DIAGNOSIS — D2311 Other benign neoplasm of skin of right eyelid, including canthus: Secondary | ICD-10-CM | POA: Diagnosis not present

## 2014-11-03 DIAGNOSIS — H1851 Endothelial corneal dystrophy: Secondary | ICD-10-CM | POA: Diagnosis not present

## 2014-11-03 DIAGNOSIS — H25812 Combined forms of age-related cataract, left eye: Secondary | ICD-10-CM | POA: Diagnosis not present

## 2014-11-03 DIAGNOSIS — D2312 Other benign neoplasm of skin of left eyelid, including canthus: Secondary | ICD-10-CM | POA: Diagnosis not present

## 2014-11-03 DIAGNOSIS — E119 Type 2 diabetes mellitus without complications: Secondary | ICD-10-CM | POA: Diagnosis not present

## 2014-11-03 DIAGNOSIS — H43813 Vitreous degeneration, bilateral: Secondary | ICD-10-CM | POA: Diagnosis not present

## 2014-11-03 DIAGNOSIS — Z961 Presence of intraocular lens: Secondary | ICD-10-CM | POA: Diagnosis not present

## 2014-12-01 DIAGNOSIS — H43813 Vitreous degeneration, bilateral: Secondary | ICD-10-CM | POA: Diagnosis not present

## 2014-12-01 DIAGNOSIS — H25812 Combined forms of age-related cataract, left eye: Secondary | ICD-10-CM | POA: Diagnosis not present

## 2014-12-01 DIAGNOSIS — D2311 Other benign neoplasm of skin of right eyelid, including canthus: Secondary | ICD-10-CM | POA: Diagnosis not present

## 2014-12-01 DIAGNOSIS — E119 Type 2 diabetes mellitus without complications: Secondary | ICD-10-CM | POA: Diagnosis not present

## 2014-12-01 DIAGNOSIS — Z961 Presence of intraocular lens: Secondary | ICD-10-CM | POA: Diagnosis not present

## 2014-12-01 DIAGNOSIS — H1851 Endothelial corneal dystrophy: Secondary | ICD-10-CM | POA: Diagnosis not present

## 2014-12-01 DIAGNOSIS — H527 Unspecified disorder of refraction: Secondary | ICD-10-CM | POA: Diagnosis not present

## 2014-12-01 DIAGNOSIS — D2312 Other benign neoplasm of skin of left eyelid, including canthus: Secondary | ICD-10-CM | POA: Diagnosis not present

## 2014-12-06 ENCOUNTER — Ambulatory Visit
Admission: RE | Admit: 2014-12-06 | Discharge: 2014-12-06 | Disposition: A | Discharge status: Home / Self Care | Payer: Medicare Other | Source: Ambulatory Visit | Attending: Family Medicine | Admitting: Family Medicine

## 2014-12-06 DIAGNOSIS — R911 Solitary pulmonary nodule: Secondary | ICD-10-CM

## 2014-12-06 DIAGNOSIS — Z87891 Personal history of nicotine dependence: Secondary | ICD-10-CM | POA: Diagnosis not present

## 2014-12-09 DIAGNOSIS — Z961 Presence of intraocular lens: Secondary | ICD-10-CM | POA: Diagnosis not present

## 2014-12-09 DIAGNOSIS — K219 Gastro-esophageal reflux disease without esophagitis: Secondary | ICD-10-CM | POA: Diagnosis not present

## 2014-12-09 DIAGNOSIS — E119 Type 2 diabetes mellitus without complications: Secondary | ICD-10-CM | POA: Diagnosis not present

## 2014-12-09 DIAGNOSIS — E780 Pure hypercholesterolemia: Secondary | ICD-10-CM | POA: Diagnosis not present

## 2014-12-09 DIAGNOSIS — G4730 Sleep apnea, unspecified: Secondary | ICD-10-CM | POA: Diagnosis not present

## 2014-12-09 DIAGNOSIS — Z7982 Long term (current) use of aspirin: Secondary | ICD-10-CM | POA: Diagnosis not present

## 2014-12-09 DIAGNOSIS — J302 Other seasonal allergic rhinitis: Secondary | ICD-10-CM | POA: Diagnosis not present

## 2014-12-09 DIAGNOSIS — I2510 Atherosclerotic heart disease of native coronary artery without angina pectoris: Secondary | ICD-10-CM | POA: Diagnosis not present

## 2014-12-09 DIAGNOSIS — H25812 Combined forms of age-related cataract, left eye: Secondary | ICD-10-CM | POA: Diagnosis not present

## 2014-12-09 DIAGNOSIS — F419 Anxiety disorder, unspecified: Secondary | ICD-10-CM | POA: Diagnosis not present

## 2014-12-09 DIAGNOSIS — Z9841 Cataract extraction status, right eye: Secondary | ICD-10-CM | POA: Diagnosis not present

## 2014-12-09 DIAGNOSIS — I10 Essential (primary) hypertension: Secondary | ICD-10-CM | POA: Diagnosis not present

## 2014-12-09 DIAGNOSIS — M1990 Unspecified osteoarthritis, unspecified site: Secondary | ICD-10-CM | POA: Diagnosis not present

## 2014-12-09 DIAGNOSIS — Z951 Presence of aortocoronary bypass graft: Secondary | ICD-10-CM | POA: Diagnosis not present

## 2014-12-09 DIAGNOSIS — Z87891 Personal history of nicotine dependence: Secondary | ICD-10-CM | POA: Diagnosis not present

## 2014-12-10 DIAGNOSIS — H25812 Combined forms of age-related cataract, left eye: Secondary | ICD-10-CM | POA: Diagnosis not present

## 2014-12-27 ENCOUNTER — Encounter: Payer: Self-pay | Admitting: Interventional Cardiology

## 2014-12-27 ENCOUNTER — Other Ambulatory Visit: Payer: Self-pay

## 2015-02-01 DIAGNOSIS — E118 Type 2 diabetes mellitus with unspecified complications: Secondary | ICD-10-CM | POA: Diagnosis not present

## 2015-02-01 DIAGNOSIS — I25119 Atherosclerotic heart disease of native coronary artery with unspecified angina pectoris: Secondary | ICD-10-CM | POA: Diagnosis not present

## 2015-02-01 DIAGNOSIS — R251 Tremor, unspecified: Secondary | ICD-10-CM | POA: Diagnosis not present

## 2015-02-01 DIAGNOSIS — I10 Essential (primary) hypertension: Secondary | ICD-10-CM | POA: Diagnosis not present

## 2015-02-01 DIAGNOSIS — D696 Thrombocytopenia, unspecified: Secondary | ICD-10-CM | POA: Diagnosis not present

## 2015-02-01 DIAGNOSIS — R202 Paresthesia of skin: Secondary | ICD-10-CM | POA: Diagnosis not present

## 2015-02-01 DIAGNOSIS — E782 Mixed hyperlipidemia: Secondary | ICD-10-CM | POA: Diagnosis not present

## 2015-02-01 DIAGNOSIS — D649 Anemia, unspecified: Secondary | ICD-10-CM | POA: Diagnosis not present

## 2015-02-01 DIAGNOSIS — R911 Solitary pulmonary nodule: Secondary | ICD-10-CM | POA: Diagnosis not present

## 2015-02-02 ENCOUNTER — Other Ambulatory Visit: Payer: Self-pay | Admitting: Family Medicine

## 2015-02-02 ENCOUNTER — Ambulatory Visit
Admission: RE | Admit: 2015-02-02 | Discharge: 2015-02-02 | Disposition: A | Discharge status: Home / Self Care | Payer: Medicare Other | Source: Ambulatory Visit | Attending: Family Medicine | Admitting: Family Medicine

## 2015-02-02 DIAGNOSIS — R519 Headache, unspecified: Secondary | ICD-10-CM

## 2015-02-02 DIAGNOSIS — R51 Headache: Secondary | ICD-10-CM | POA: Diagnosis not present

## 2015-02-02 DIAGNOSIS — I2510 Atherosclerotic heart disease of native coronary artery without angina pectoris: Secondary | ICD-10-CM | POA: Diagnosis not present

## 2015-02-02 DIAGNOSIS — I10 Essential (primary) hypertension: Secondary | ICD-10-CM | POA: Diagnosis not present

## 2015-02-02 DIAGNOSIS — R42 Dizziness and giddiness: Secondary | ICD-10-CM | POA: Diagnosis not present

## 2015-02-02 DIAGNOSIS — R410 Disorientation, unspecified: Secondary | ICD-10-CM | POA: Diagnosis not present

## 2015-02-18 ENCOUNTER — Other Ambulatory Visit: Payer: Self-pay | Admitting: Family Medicine

## 2015-02-18 DIAGNOSIS — R42 Dizziness and giddiness: Secondary | ICD-10-CM

## 2015-03-01 ENCOUNTER — Ambulatory Visit
Admission: RE | Admit: 2015-03-01 | Discharge: 2015-03-01 | Disposition: A | Discharge status: Home / Self Care | Payer: Medicare Other | Source: Ambulatory Visit | Attending: Family Medicine | Admitting: Family Medicine

## 2015-03-01 DIAGNOSIS — R42 Dizziness and giddiness: Secondary | ICD-10-CM

## 2015-03-01 DIAGNOSIS — R51 Headache: Secondary | ICD-10-CM | POA: Diagnosis not present

## 2015-03-01 MED ORDER — GADOBENATE DIMEGLUMINE 529 MG/ML IV SOLN
20.0000 mL | Freq: Once | INTRAVENOUS | Status: AC | PRN
Start: 2015-03-01 — End: 2015-03-01
  Administered 2015-03-01: 20 mL via INTRAVENOUS

## 2015-03-09 DIAGNOSIS — Z23 Encounter for immunization: Secondary | ICD-10-CM | POA: Diagnosis not present

## 2015-03-30 ENCOUNTER — Encounter: Payer: Self-pay | Admitting: Neurology

## 2015-03-30 ENCOUNTER — Ambulatory Visit (INDEPENDENT_AMBULATORY_CARE_PROVIDER_SITE_OTHER): Payer: Medicare Other | Admitting: Neurology

## 2015-03-30 VITALS — BP 122/78 | HR 68 | Ht 76.0 in | Wt 280.0 lb

## 2015-03-30 DIAGNOSIS — R269 Unspecified abnormalities of gait and mobility: Secondary | ICD-10-CM

## 2015-03-30 DIAGNOSIS — I25810 Atherosclerosis of coronary artery bypass graft(s) without angina pectoris: Secondary | ICD-10-CM | POA: Diagnosis not present

## 2015-03-30 NOTE — Progress Notes (Signed)
Chief Complaint  Patient presents with  . Uncoordinated movements    Says he has been experiencing uncoordinated movements and an unsteady gait.  Says he does not feel dizzy.  He has fallen multiple times and is dropping things.  He has recently had a MRI brain scan.  . Tremors    He is still having tremors in his bilateral hands.      PATIENT: Jacob [name] DOB: 11/12/1932  HISTORICAL  Jacob [name] is 81 yo RH male, alone at visit, he is referred by his primary care physician Hulan Fess for evaluation of bilateral hands tremor.  He has history of hypertension, hyperlipidemia, type 2 diabetes, coronary artery disease,  Since 2014, he noticed intermittent bilateral hands tremor, right worse than left, especially when he tried to use utensils, writing, he noticed difficulty holding his right hand steady,  He denies gait difficulty, no loss sense of smell, no REM sleep disorder,  He still driving, active at home  UPDATE Sep 28th 2016: He was referred his PCP Dr. Rex Kras for unbalance gait, falling down, this has get worse since early August 2016, he fell fell on the doorway, fell down steps, difficulty clear his feet from a thin carpet, he also complains of mild midline neck pain, urinary urgency, occasionally incontinence. He also complains of worsening memory, difficulty in finding his way driving occasionally,  REVIEW OF SYSTEMS: Full 14 system review of systems performed and notable only for weight gain, fatigue, allergy, confusion, racing thoughts.  ALLERGIES: Allergies  Allergen Reactions  . Ivp Dye [Iodinated Diagnostic Agents] Hives    Hives Hives   . Amlodipine Besylate Other (See Comments)    Dizziness  . Lisinopril Cough    Cough    HOME MEDICATIONS: Current Outpatient Prescriptions  Medication Sig Dispense Refill  . acetaminophen (TYLENOL) 650 MG CR tablet Take 500 mg by mouth every evening.     [name] alfuzosin (UROXATRAL) 10 MG 24 hr tablet Take 10 mg by mouth  daily.     [name] aspirin 81 MG tablet Take 60 mg by mouth daily.     . carvedilol (COREG) 12.5 MG tablet Take 1 tablet (12.5 mg total) by mouth 2 (two) times daily with a meal.    . Cetirizine HCl 10 MG CAPS Take 10 mg by mouth daily.    . hydrochlorothiazide (MICROZIDE) 12.5 MG capsule Take 12.5 mg by mouth daily.     [name] losartan (COZAAR) 50 MG tablet Take 1 tablet (50 mg total) by mouth at bedtime.    . metFORMIN (GLUCOPHAGE) 500 MG tablet Take 500 mg by mouth 2 (two) times daily with a meal.    . NITROSTAT 0.4 MG SL tablet Place 0.4 mg under the tongue daily as needed. For chest pain    . sertraline (ZOLOFT) 50 MG tablet Take 50 mg by mouth daily.    . simvastatin (ZOCOR) 40 MG tablet Take 40 mg by mouth at bedtime.        PAST MEDICAL HISTORY: Past Medical History  Diagnosis Date  . Diabetes mellitus without complication   . Hyperlipidemia   . Myocardial infarction 2005  . Hypertension     followed by Dr. Linard Millers   . Depression   . Anxiety   . Sleep apnea     CPAP almost q night   . Cold     common cold, reports that he has a lingering cold   . Headache(784.0)     sinus headaches   .  Arthritis     degenerative spine   . Vertigo   . Hypertension   . Thrombocytopenia     PAST SURGICAL HISTORY: Past Surgical History  Procedure Laterality Date  . Coronary artery bypass graft  2005    triple bypass  . Cardiac catheterization    . Eye surgery      lasik procedure   . Inguinal hernia repair  07/15/2012    Procedure: LAPAROSCOPIC INGUINAL HERNIA;  Surgeon: [name], MD,FACS;  Location: [place];  Service: General;  Laterality: Right;  laparoscopic repair right inguinal hernia with mesh  . Insertion of mesh  07/15/2012    Procedure: INSERTION OF MESH;  Surgeon: [name], MD,FACS;  Location: [place];  Service: General;  Laterality: Right;  right inguinal area    FAMILY HISTORY: Family History  Problem Relation Age of Onset  . Cancer Sister     lung and breast  . Cancer  Brother     lung - 2 brothers  . Emphysema Mother   . Heart disease Mother     SOCIAL HISTORY:  History   Social History  . Marital Status: Married    Spouse Name: N/A  . Number of Children: 2  . Years of Education: N/A   Occupational History  . Not on file.   Social History Main Topics  . Smoking status: Former Research scientist (life sciences)  . Smokeless tobacco: Former Systems developer    Quit date: 06/13/1984  . Alcohol Use: Yes     Comment: occasional- "once per week" - one drink  . Drug Use: No  . Sexual Activity: Not on file   Other Topics Concern  . Not on file   Social History Narrative     PHYSICAL EXAM   Filed Vitals:   03/30/15 1508  BP: 122/78  Pulse: 68  Height: 6\' 4"  (1.93 m)  Weight: 280 lb (127.007 kg)    Not recorded      Body mass index is 34.1 kg/(m^2).  PHYSICAL EXAMNIATION:  Gen: NAD, conversant, well nourised, obese, well groomed                     Cardiovascular: Regular rate rhythm, no peripheral edema, warm, nontender. Eyes: Conjunctivae clear without exudates or hemorrhage Neck: Supple, no carotid bruise. Pulmonary: Clear to auscultation bilaterally   NEUROLOGICAL EXAM:  MENTAL STATUS: Speech:    Speech is normal; fluent and spontaneous with normal comprehension.  Cognition:    The patient is oriented to person, place, and time;     recent and remote memory intact;     language fluent;     normal attention, concentration,     fund of knowledge.  CRANIAL NERVES: CN II: Visual fields are full to confrontation. Pupil were equal round reactive to light  CN III, IV, VI: extraocular movement are normal. No ptosis. CN V: Facial sensation is intact to pinprick in all 3 divisions bilaterally. Corneal responses are intact.  CN VII: Face is symmetric with normal eye closure and smile. CN VIII: Hearing is normal to rubbing fingers CN IX, X: Palate elevates symmetrically. Phonation is normal. CN XI: Head turning and shoulder shrug are intact CN XII: Tongue is  midline with normal movements and no atrophy.  MOTOR:  Mild bilateral hands posturing tremor, slight bilateral finger abduction weakness.   REFLEXES: Reflexes are 2+ and symmetric at the biceps, triceps, knees, and ankles. Plantar responses are flexor.  SENSORY:  length dependent decreased light touch,  pinprick, position sense, and vibration sensation at toes.  COORDINATION: Rapid alternating movements and fine finger movements are intact. There is no dysmetria on finger-to-nose and heel-knee-shin.   GAIT/STANCE: Posture is normal. Mildly stiff, wide based gait  Romberg is absent.   DIAGNOSTIC DATA (LABS, IMAGING, TESTING) - I reviewed patient records, labs, notes, testing and imaging myself where available.  Lab Results  Component Value Date   WBC 7.9 07/14/2012   HGB 13.2 07/14/2012   HCT 39.0 07/14/2012   MCV 83.3 07/14/2012   PLT 130* 07/14/2012      Component Value Date/Time   NA 141 07/14/2012 1520   K 4.2 07/14/2012 1520   CL 103 07/14/2012 1520   CO2 28 07/14/2012 1520   GLUCOSE 100* 07/14/2012 1520   BUN 24* 07/14/2012 1520   CREATININE 1.08 07/14/2012 1520   CALCIUM 9.9 07/14/2012 1520   PROT 5.9* 02/27/2007 1859   ALBUMIN 3.0* 02/27/2007 1859   AST 43* 02/27/2007 1859   ALT 58* 02/27/2007 1859   ALKPHOS 66 02/27/2007 1859   BILITOT 0.8 02/27/2007 1859   GFRNONAA 64* 07/14/2012 1520   GFRAA 74* 07/14/2012 1520   No results found for: CHOL, HDL, LDLCALC, LDLDIRECT, TRIG, CHOLHDL Lab Results  Component Value Date   HGBA1C * 02/28/2007    7.9 (NOTE)   The ADA recommends the following therapeutic goals for glycemic   control related to Hgb A1C measurement:   Goal of Therapy:   < 7.0% Hgb A1C   Action Suggested:  > 8.0% Hgb A1C   Ref:  Diabetes Care, 31, Suppl. 1, 1999   No results found for: VITAMINB12 No results found for: TSH    ASSESSMENT AND PLAN  Jacob [name] is a 81 y.o. male  right-handed Caucasian male, with past medical history of  hypertension, diabetes, mild diabetic peripheral neuropathy, coronary artery disease,  Essential tremor Worsening unsteady gait, urinary urgency, occasionally incontinence.  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 Ph.D.  [facility] [address], [place], [place] [address] Ph: [phone] Fax: [phone]

## 2015-04-11 ENCOUNTER — Telehealth: Payer: Self-pay | Admitting: Neurology

## 2015-04-11 NOTE — Telephone Encounter (Signed)
Patient called to check status of referral for test ?CT scan of neck. Please call patient to advise [phone].

## 2015-04-11 NOTE — Telephone Encounter (Signed)
Spoke with patient regarding his MRI C-SPINE, I advised him that I sent the order to [place] on 03/31/15 due to him having Medicare. Patient expressed that he can not afford the MRI and declined it for now.

## 2015-04-18 ENCOUNTER — Ambulatory Visit: Payer: Medicare Other | Admitting: Neurology

## 2015-05-09 DIAGNOSIS — I10 Essential (primary) hypertension: Secondary | ICD-10-CM | POA: Diagnosis not present

## 2015-07-27 DIAGNOSIS — E782 Mixed hyperlipidemia: Secondary | ICD-10-CM | POA: Diagnosis not present

## 2015-07-27 DIAGNOSIS — R911 Solitary pulmonary nodule: Secondary | ICD-10-CM | POA: Diagnosis not present

## 2015-07-27 DIAGNOSIS — Z7984 Long term (current) use of oral hypoglycemic drugs: Secondary | ICD-10-CM | POA: Diagnosis not present

## 2015-07-27 DIAGNOSIS — I25119 Atherosclerotic heart disease of native coronary artery with unspecified angina pectoris: Secondary | ICD-10-CM | POA: Diagnosis not present

## 2015-07-27 DIAGNOSIS — D649 Anemia, unspecified: Secondary | ICD-10-CM | POA: Diagnosis not present

## 2015-07-27 DIAGNOSIS — E118 Type 2 diabetes mellitus with unspecified complications: Secondary | ICD-10-CM | POA: Diagnosis not present

## 2015-07-27 DIAGNOSIS — D696 Thrombocytopenia, unspecified: Secondary | ICD-10-CM | POA: Diagnosis not present

## 2015-07-27 DIAGNOSIS — R251 Tremor, unspecified: Secondary | ICD-10-CM | POA: Diagnosis not present

## 2015-07-27 DIAGNOSIS — I10 Essential (primary) hypertension: Secondary | ICD-10-CM | POA: Diagnosis not present

## 2015-07-27 DIAGNOSIS — R202 Paresthesia of skin: Secondary | ICD-10-CM | POA: Diagnosis not present

## 2015-07-27 DIAGNOSIS — G4733 Obstructive sleep apnea (adult) (pediatric): Secondary | ICD-10-CM | POA: Diagnosis not present

## 2015-07-29 ENCOUNTER — Encounter: Payer: Self-pay | Admitting: Interventional Cardiology

## 2015-07-29 ENCOUNTER — Ambulatory Visit (INDEPENDENT_AMBULATORY_CARE_PROVIDER_SITE_OTHER): Payer: Medicare Other | Admitting: Interventional Cardiology

## 2015-07-29 VITALS — BP 142/74 | HR 72 | Ht 74.0 in | Wt 282.8 lb

## 2015-07-29 DIAGNOSIS — I25810 Atherosclerosis of coronary artery bypass graft(s) without angina pectoris: Secondary | ICD-10-CM

## 2015-07-29 DIAGNOSIS — I10 Essential (primary) hypertension: Secondary | ICD-10-CM

## 2015-07-29 DIAGNOSIS — E785 Hyperlipidemia, unspecified: Secondary | ICD-10-CM

## 2015-07-29 DIAGNOSIS — N289 Disorder of kidney and ureter, unspecified: Secondary | ICD-10-CM | POA: Diagnosis not present

## 2015-07-29 MED ORDER — CARVEDILOL 12.5 MG PO TABS: 6.2500 mg | ORAL_TABLET | Freq: Two times a day (BID) | ORAL | Status: DC

## 2015-07-29 NOTE — Progress Notes (Signed)
Cardiology Office Note   Date:  07/29/2015   ID:  Jacob [name], DOB 25-Jul-1932, MRN [id number]  PCP:  Gennette Pac, MD  Cardiologist:  Sinclair Grooms, MD   Chief Complaint  Patient presents with  . Coronary Artery Disease      History of Present Illness: Jacob [name] is a 82 y.o. male who presents for  CAD,  Triple-vessel CABG,hypertension, prior inferior infarct, hyperlipidemia, and aortic stenosis.  Again [name] is adamant that if he takes carvedilol as prescribed that he has severe episodes of dizziness and near fainting. He also states his exertional tolerance is decreasing. He denies chest pain, orthopnea, and PND. No  Edema has been noted I reviewed does have orthostatic dizziness if he takes carvedilol 12.5 mg twice a day. He now takes only 6.25 mg of carvedilol in the morning and states that he feels much better.   Past Medical History  Diagnosis Date  . Diabetes mellitus without complication ([name])   . Hyperlipidemia   . Myocardial infarction ([place]) 2005  . Hypertension     followed by Dr. Linard Millers   . Depression   . Anxiety   . Sleep apnea     CPAP almost q night   . Cold     common cold, reports that he has a lingering cold   . Headache(784.0)     sinus headaches   . Arthritis     degenerative spine   . Vertigo   . Hypertension   . Thrombocytopenia [facility])     Past Surgical History  Procedure Laterality Date  . Coronary artery bypass graft  2005    triple bypass  . Cardiac catheterization    . Eye surgery      lasik procedure   . Inguinal hernia repair  07/15/2012    Procedure: LAPAROSCOPIC INGUINAL HERNIA;  Surgeon: [name], MD,FACS;  Location: [place];  Service: General;  Laterality: Right;  laparoscopic repair right inguinal hernia with mesh  . Insertion of mesh  07/15/2012    Procedure: INSERTION OF MESH;  Surgeon: [name], MD,FACS;  Location: [place];  Service: General;  Laterality: Right;  right inguinal area     Current  Outpatient Prescriptions  Medication Sig Dispense Refill  . acetaminophen (TYLENOL) 650 MG CR tablet Take 500 mg by mouth every evening.     [name] alfuzosin (UROXATRAL) 10 MG 24 hr tablet Take 10 mg by mouth daily.     [name] aspirin 81 MG tablet Take 60 mg by mouth daily.     . carvedilol (COREG) 12.5 MG tablet Take 1 tablet (12.5 mg total) by mouth 2 (two) times daily with a meal.    . Cetirizine HCl 10 MG CAPS Take 10 mg by mouth daily.    . hydrochlorothiazide (MICROZIDE) 12.5 MG capsule Take 12.5 mg by mouth daily.     [name] losartan (COZAAR) 50 MG tablet Take 1 tablet (50 mg total) by mouth at bedtime.    . metFORMIN (GLUCOPHAGE) 500 MG tablet Take 500 mg by mouth 2 (two) times daily with a meal.    . NITROSTAT 0.4 MG SL tablet Place 0.4 mg under the tongue daily as needed. For chest pain    . sertraline (ZOLOFT) 50 MG tablet Take 50 mg by mouth daily.    . simvastatin (ZOCOR) 40 MG tablet Take 40 mg by mouth at bedtime.      No current facility-administered medications for this visit.  Allergies:   Ivp dye; Amlodipine besylate; and Lisinopril    Social History:  The patient  reports that he quit smoking about 27 years ago. He quit smokeless tobacco use about 31 years ago. He reports that he drinks about 1.2 oz of alcohol per week. He reports that he does not use illicit drugs.   Family History:  The patient's family history includes Cancer in his sister; Emphysema in his mother; Heart disease in his mother; Lung cancer in his brother and brother.    ROS:  Please see the history of present illness.   Otherwise, review of systems are positive for  Insomnia if he does not use, difficulty with balance, headache, dizziness, easy bruising, back pain, blood in stool and diarrhea. Decreased physical activity.   All other systems are reviewed and negative.    PHYSICAL EXAM: VS:  BP 142/74 mmHg  Pulse 72  Ht 6\' 2"  (1.88 m)  Wt 282 lb 12.8 oz (128.277 kg)  BMI 36.29 kg/m2 , BMI Body mass index is  36.29 kg/(m^2). GEN: Well nourished, well developed, in no acute distress. Obese HEENT: normal Neck: no JVD, carotid bruits, or masses Cardiac: RRR.  There is  2/6 systolic murmur of aortic stenosis. There is no rub, or gallop. There is  no edema. Respiratory:  clear to auscultation bilaterally, normal work of breathing. GI: soft, nontender, nondistended, + BS MS: no deformity or atrophy Skin: warm and dry, no rash Neuro:  Strength and sensation are intact Psych: euthymic mood, full affect   EKG:  EKG is ordered today. The ekg reveals  Normal sinus rhythm, first-degree AV block, evidence of prior inferior infarction , otherwise unremarkable.   Recent Labs: No results found for requested labs within last 365 days.    Lipid Panel No results found for: CHOL, TRIG, HDL, CHOLHDL, VLDL, LDLCALC, LDLDIRECT    Wt Readings from Last 3 Encounters:  07/29/15 282 lb 12.8 oz (128.277 kg)  03/30/15 280 lb (127.007 kg)  10/18/14 287 lb 3.2 oz (130.273 kg)      Other studies Reviewed: Additional studies/ records that were reviewed today include:  Electronic health records.. The findings include  First-degree AV block is noted on prior EKGs and a slightly longer today at 288 ms from 273 ms E year ago..    ASSESSMENT AND PLAN:  1. Coronary artery disease involving coronary bypass graft of native heart without angina pectoris  prior coronary bypass grafting without angina  2. Essential hypertension  controlled  3. Renal insufficiency  not recently checked  4. Hyperlipidemia  followed by primary care   5. Aortic stenosis  last evaluation 2 years ago revealed mild aortic stenosis.    Current medicines are reviewed at length with the patient today.  The patient has the following concerns regarding medicines:  Decrease carvedilol to 6.25 mg twice a day.  The following changes/actions have been instituted:     2-D Doppler echocardiogram to monitor  Aortic stenosis   decrease  carvedilol to 6.25 mg twice a day. Perhaps lightheadedness and dizziness all the higher dose has been related to high-grade AV block in the setting of first-degree AV block   Monitor blood pressure daily all reduced dose carvedilol  Labs/ tests ordered today include:  No orders of the defined types were placed in this encounter.     Disposition:   FU with HS in 1 year  Signed, Sinclair Grooms, MD  07/29/2015 11:03 AM    [facility] [place], [place], [place]  [address] Phone: [phone]; Fax: [phone]

## 2015-07-29 NOTE — Patient Instructions (Signed)
Your physician has recommended you make the following change in your medication:  1.) DECREASE YOUR CARVEDILOL (COREG) TO 6.25 MG TWICE DAILY --  Your physician has requested that you have an echocardiogram. Echocardiography is a painless test that uses sound waves to create images of your heart. It provides your doctor with information about the size and shape of your heart and how well your heart's chambers and valves are working. This procedure takes approximately one hour. There are no restrictions for this procedure.  Your physician wants you to follow-up in: [place].   You will receive a reminder letter in the mail two months in advance. If you don't receive a letter, please call our office to schedule the follow-up appointment.  PLEASE CHECK AND RECORD YOUR BLOOD PRESSURES FOR A FEW WEEKS, A COUPLE HOURS AFTER YOUR MORNING DOSE OF CARVEDILOL.  PLEASE CALL OR EMAIL YOUR READINGS TO DR. Tamala Julian.

## 2015-08-03 DIAGNOSIS — E119 Type 2 diabetes mellitus without complications: Secondary | ICD-10-CM | POA: Diagnosis not present

## 2015-08-03 DIAGNOSIS — H40013 Open angle with borderline findings, low risk, bilateral: Secondary | ICD-10-CM | POA: Diagnosis not present

## 2015-08-10 ENCOUNTER — Other Ambulatory Visit: Payer: Self-pay

## 2015-08-10 ENCOUNTER — Ambulatory Visit (HOSPITAL_COMMUNITY): Payer: Medicare Other | Attending: Cardiovascular Disease

## 2015-08-10 DIAGNOSIS — I350 Nonrheumatic aortic (valve) stenosis: Secondary | ICD-10-CM | POA: Diagnosis not present

## 2015-08-10 DIAGNOSIS — E785 Hyperlipidemia, unspecified: Secondary | ICD-10-CM

## 2015-08-10 DIAGNOSIS — Z951 Presence of aortocoronary bypass graft: Secondary | ICD-10-CM | POA: Diagnosis not present

## 2015-08-10 DIAGNOSIS — I352 Nonrheumatic aortic (valve) stenosis with insufficiency: Secondary | ICD-10-CM | POA: Insufficient documentation

## 2015-08-10 DIAGNOSIS — I517 Cardiomegaly: Secondary | ICD-10-CM | POA: Diagnosis not present

## 2015-08-10 DIAGNOSIS — N289 Disorder of kidney and ureter, unspecified: Secondary | ICD-10-CM

## 2015-08-10 DIAGNOSIS — I25810 Atherosclerosis of coronary artery bypass graft(s) without angina pectoris: Secondary | ICD-10-CM | POA: Diagnosis not present

## 2015-08-10 DIAGNOSIS — E119 Type 2 diabetes mellitus without complications: Secondary | ICD-10-CM | POA: Diagnosis not present

## 2015-08-10 DIAGNOSIS — I340 Nonrheumatic mitral (valve) insufficiency: Secondary | ICD-10-CM | POA: Diagnosis not present

## 2015-08-10 DIAGNOSIS — I10 Essential (primary) hypertension: Secondary | ICD-10-CM | POA: Insufficient documentation

## 2015-08-31 DIAGNOSIS — E119 Type 2 diabetes mellitus without complications: Secondary | ICD-10-CM | POA: Diagnosis not present

## 2015-08-31 DIAGNOSIS — H40012 Open angle with borderline findings, low risk, left eye: Secondary | ICD-10-CM | POA: Diagnosis not present

## 2015-11-22 DIAGNOSIS — D485 Neoplasm of uncertain behavior of skin: Secondary | ICD-10-CM | POA: Diagnosis not present

## 2015-11-22 DIAGNOSIS — L820 Inflamed seborrheic keratosis: Secondary | ICD-10-CM | POA: Diagnosis not present

## 2015-11-22 DIAGNOSIS — Z85828 Personal history of other malignant neoplasm of skin: Secondary | ICD-10-CM | POA: Diagnosis not present

## 2015-11-22 DIAGNOSIS — L570 Actinic keratosis: Secondary | ICD-10-CM | POA: Diagnosis not present

## 2015-11-22 DIAGNOSIS — Z08 Encounter for follow-up examination after completed treatment for malignant neoplasm: Secondary | ICD-10-CM | POA: Diagnosis not present

## 2015-11-22 DIAGNOSIS — L821 Other seborrheic keratosis: Secondary | ICD-10-CM | POA: Diagnosis not present

## 2016-01-02 DIAGNOSIS — I10 Essential (primary) hypertension: Secondary | ICD-10-CM | POA: Diagnosis not present

## 2016-01-02 DIAGNOSIS — E118 Type 2 diabetes mellitus with unspecified complications: Secondary | ICD-10-CM | POA: Diagnosis not present

## 2016-01-04 DIAGNOSIS — I712 Thoracic aortic aneurysm, without rupture: Secondary | ICD-10-CM | POA: Diagnosis not present

## 2016-01-04 DIAGNOSIS — F338 Other recurrent depressive disorders: Secondary | ICD-10-CM | POA: Diagnosis not present

## 2016-01-04 DIAGNOSIS — I350 Nonrheumatic aortic (valve) stenosis: Secondary | ICD-10-CM | POA: Diagnosis not present

## 2016-01-04 DIAGNOSIS — I2510 Atherosclerotic heart disease of native coronary artery without angina pectoris: Secondary | ICD-10-CM | POA: Diagnosis not present

## 2016-01-04 DIAGNOSIS — D696 Thrombocytopenia, unspecified: Secondary | ICD-10-CM | POA: Diagnosis not present

## 2016-01-04 DIAGNOSIS — E782 Mixed hyperlipidemia: Secondary | ICD-10-CM | POA: Diagnosis not present

## 2016-01-04 DIAGNOSIS — G4733 Obstructive sleep apnea (adult) (pediatric): Secondary | ICD-10-CM | POA: Diagnosis not present

## 2016-01-04 DIAGNOSIS — I10 Essential (primary) hypertension: Secondary | ICD-10-CM | POA: Diagnosis not present

## 2016-01-04 DIAGNOSIS — H6120 Impacted cerumen, unspecified ear: Secondary | ICD-10-CM | POA: Diagnosis not present

## 2016-01-04 DIAGNOSIS — E118 Type 2 diabetes mellitus with unspecified complications: Secondary | ICD-10-CM | POA: Diagnosis not present

## 2016-01-04 DIAGNOSIS — N401 Enlarged prostate with lower urinary tract symptoms: Secondary | ICD-10-CM | POA: Diagnosis not present

## 2016-01-04 DIAGNOSIS — Z0000 Encounter for general adult medical examination without abnormal findings: Secondary | ICD-10-CM | POA: Diagnosis not present

## 2016-01-07 ENCOUNTER — Other Ambulatory Visit: Payer: Self-pay | Admitting: Family Medicine

## 2016-01-07 DIAGNOSIS — I712 Thoracic aortic aneurysm, without rupture: Secondary | ICD-10-CM

## 2016-01-07 DIAGNOSIS — I7121 Aneurysm of the ascending aorta, without rupture: Secondary | ICD-10-CM

## 2016-01-26 ENCOUNTER — Other Ambulatory Visit: Payer: Self-pay | Admitting: Family Medicine

## 2016-02-17 DIAGNOSIS — K449 Diaphragmatic hernia without obstruction or gangrene: Secondary | ICD-10-CM | POA: Diagnosis not present

## 2016-02-17 DIAGNOSIS — J984 Other disorders of lung: Secondary | ICD-10-CM | POA: Diagnosis not present

## 2016-02-17 DIAGNOSIS — I712 Thoracic aortic aneurysm, without rupture: Secondary | ICD-10-CM | POA: Diagnosis not present

## 2016-02-23 ENCOUNTER — Telehealth: Payer: Self-pay | Admitting: Interventional Cardiology

## 2016-02-23 ENCOUNTER — Encounter: Payer: Self-pay | Admitting: Interventional Cardiology

## 2016-02-23 NOTE — Telephone Encounter (Signed)
Diane Kerman Passey is a Marine scientist who is checking on Jacob [name].  She called stating that the patient's pulse rate is in the 30s and 40s.  Other than that he looks good and his color his good.  She just wanted his cardiologist to be aware of this.

## 2016-02-25 ENCOUNTER — Other Ambulatory Visit: Payer: Self-pay | Admitting: Family Medicine

## 2016-02-25 ENCOUNTER — Encounter: Payer: Self-pay | Admitting: Interventional Cardiology

## 2016-02-25 DIAGNOSIS — R9389 Abnormal findings on diagnostic imaging of other specified body structures: Secondary | ICD-10-CM

## 2016-02-27 NOTE — Telephone Encounter (Signed)
Decrease the Carvedilol to 6.25 mg BID. 24 hour holter monitor. Who ordered the stress test?

## 2016-02-27 NOTE — Telephone Encounter (Signed)
Called to check on pt after receiving call and my chart message. Pt sts that he is asymptomatic, he was scheduled to have a gxt at another office location. The test was unable to be performed because he was bradycardic. Pt repots that he resting heartrate was in the 30-40's. Pt was told that they would not perform the test for resting a heartrate <50bpm. Verified how pt is taking his Carvedilol. Pt sts that he takes 1 tablet 12..5mg  at bedtime, instead of how is was prescribed 6.25mg  bid. Adv pt on the reasoning for taking Carvedilol bid, pt sts that it was making him sluggish when he would take it during the day. Pt reports that he did take Carvedilol the night before he was scheduled for his test. Adv pt that I will fwd Dr.Smith an update and call back if he has any additional recommendations. Pt verbalized understanding and voiced appreciation for the call.

## 2016-02-28 ENCOUNTER — Other Ambulatory Visit: Payer: Self-pay | Admitting: *Deleted

## 2016-02-28 ENCOUNTER — Telehealth: Payer: Self-pay | Admitting: *Deleted

## 2016-02-28 ENCOUNTER — Ambulatory Visit (INDEPENDENT_AMBULATORY_CARE_PROVIDER_SITE_OTHER): Payer: Medicare Other | Admitting: *Deleted

## 2016-02-28 ENCOUNTER — Ambulatory Visit: Payer: Medicare Other

## 2016-02-28 ENCOUNTER — Encounter: Payer: Self-pay | Admitting: Interventional Cardiology

## 2016-02-28 VITALS — BP 134/76 | HR 64

## 2016-02-28 DIAGNOSIS — I441 Atrioventricular block, second degree: Secondary | ICD-10-CM | POA: Diagnosis not present

## 2016-02-28 DIAGNOSIS — R001 Bradycardia, unspecified: Secondary | ICD-10-CM

## 2016-02-28 NOTE — Telephone Encounter (Signed)
From: [name]  Sent: 02/25/2016  9:07 PM  To: [facility] Triage  Subject: Non-Urgent Medical Question             [facility] research call you on the 24 concerning my low heart rate (35 at that time) I assume that is not a problem as i have not heard from anyone, [facility] or you.    Jacob [name]      Per Dr. Tamala Julian, patient should come for EKG today, he is DOD. Called patient, he cannot come for EKG until 3 pm.  Added to nurse room schedule for today.

## 2016-02-28 NOTE — Patient Instructions (Addendum)
Your physician has recommended you make the following change in your medication:  1.) STOP CARVEDILOL  Your physician has recommended that you wear a holter monitor. Holter monitors are medical devices that record the heart's electrical activity. Doctors most often use these monitors to diagnose arrhythmias. Arrhythmias are problems with the speed or rhythm of the heartbeat. The monitor is a small, portable device. You can wear one while you do your normal daily activities. This is usually used to diagnose what is causing palpitations/syncope (passing out).  Check your blood pressure and heart rate every day.  Keep a record.

## 2016-02-28 NOTE — Progress Notes (Signed)
1.) Reason for visit: EKG, report of slow heart rate by patient MyChart message   2.) Name of MD requesting visit: Dr. 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  He was going to have a GXT and was unable to participate due to low heart rate.    4.) ROS related to problem: Pt is on Coreg, is unsure of dose, but only takes 1 pill once daily at bedtime because he feels so sluggish if takes BID.  5.) Assessment and plan per MD: EKG shows SR 2nd degree AV block with occas PVC---per Dr. Irving Copas carvedilol, obtain 48 hr holter monitor in 7-10 days.  Pt is scheduled for 03/08/16 to get holter.

## 2016-02-29 NOTE — Telephone Encounter (Signed)
Addressed on 02/28/16

## 2016-03-07 DIAGNOSIS — R5383 Other fatigue: Secondary | ICD-10-CM | POA: Diagnosis not present

## 2016-03-08 ENCOUNTER — Ambulatory Visit (INDEPENDENT_AMBULATORY_CARE_PROVIDER_SITE_OTHER): Payer: Medicare Other

## 2016-03-08 DIAGNOSIS — I441 Atrioventricular block, second degree: Secondary | ICD-10-CM | POA: Diagnosis not present

## 2016-03-19 ENCOUNTER — Encounter: Payer: Self-pay | Admitting: Interventional Cardiology

## 2016-03-19 DIAGNOSIS — J984 Other disorders of lung: Secondary | ICD-10-CM | POA: Diagnosis not present

## 2016-03-19 DIAGNOSIS — R938 Abnormal findings on diagnostic imaging of other specified body structures: Secondary | ICD-10-CM | POA: Diagnosis not present

## 2016-03-27 ENCOUNTER — Telehealth: Payer: Self-pay | Admitting: Interventional Cardiology

## 2016-03-27 NOTE — Telephone Encounter (Signed)
New message ° ° ° ° ° ° °Pt returning nurse call  °

## 2016-03-28 NOTE — Telephone Encounter (Signed)
-----   Message from Belva Crome, MD sent at 03/25/2016  2:49 PM EDT ----- Improved with no severe bradycardia. If bradycardia recurs, i.e. dizzy symptoms as before, may need pacemaker. We will follow for now.

## 2016-03-28 NOTE — Telephone Encounter (Signed)
Returned call. lmom with holter results.Improved with no severe bradycardia. If bradycardia recurs, i.e. dizzy symptoms as before, may need pacemaker. We will follow for now.  Pt is to call back if any questions.

## 2016-04-03 DIAGNOSIS — Z23 Encounter for immunization: Secondary | ICD-10-CM | POA: Diagnosis not present

## 2016-04-11 DIAGNOSIS — G4730 Sleep apnea, unspecified: Secondary | ICD-10-CM | POA: Diagnosis not present

## 2016-04-11 DIAGNOSIS — R0602 Shortness of breath: Secondary | ICD-10-CM | POA: Diagnosis not present

## 2016-05-29 DIAGNOSIS — Z85828 Personal history of other malignant neoplasm of skin: Secondary | ICD-10-CM | POA: Diagnosis not present

## 2016-05-29 DIAGNOSIS — Z08 Encounter for follow-up examination after completed treatment for malignant neoplasm: Secondary | ICD-10-CM | POA: Diagnosis not present

## 2016-05-29 DIAGNOSIS — L570 Actinic keratosis: Secondary | ICD-10-CM | POA: Diagnosis not present

## 2016-07-03 ENCOUNTER — Other Ambulatory Visit: Payer: Self-pay

## 2016-07-03 MED ORDER — NITROGLYCERIN 0.4 MG SL SUBL: 0.4000 mg | SUBLINGUAL_TABLET | Freq: Every day | SUBLINGUAL | 1 refills | Status: AC | PRN

## 2016-07-12 DIAGNOSIS — Z7984 Long term (current) use of oral hypoglycemic drugs: Secondary | ICD-10-CM | POA: Diagnosis not present

## 2016-07-12 DIAGNOSIS — Z86010 Personal history of colonic polyps: Secondary | ICD-10-CM | POA: Diagnosis not present

## 2016-07-12 DIAGNOSIS — G4733 Obstructive sleep apnea (adult) (pediatric): Secondary | ICD-10-CM | POA: Diagnosis not present

## 2016-07-12 DIAGNOSIS — R0602 Shortness of breath: Secondary | ICD-10-CM | POA: Diagnosis not present

## 2016-07-12 DIAGNOSIS — E782 Mixed hyperlipidemia: Secondary | ICD-10-CM | POA: Diagnosis not present

## 2016-07-12 DIAGNOSIS — F338 Other recurrent depressive disorders: Secondary | ICD-10-CM | POA: Diagnosis not present

## 2016-07-12 DIAGNOSIS — R918 Other nonspecific abnormal finding of lung field: Secondary | ICD-10-CM | POA: Diagnosis not present

## 2016-07-12 DIAGNOSIS — I10 Essential (primary) hypertension: Secondary | ICD-10-CM | POA: Diagnosis not present

## 2016-07-12 DIAGNOSIS — E118 Type 2 diabetes mellitus with unspecified complications: Secondary | ICD-10-CM | POA: Diagnosis not present

## 2016-07-12 DIAGNOSIS — Z0000 Encounter for general adult medical examination without abnormal findings: Secondary | ICD-10-CM | POA: Diagnosis not present

## 2016-07-12 DIAGNOSIS — R5383 Other fatigue: Secondary | ICD-10-CM | POA: Diagnosis not present

## 2016-07-12 DIAGNOSIS — I2510 Atherosclerotic heart disease of native coronary artery without angina pectoris: Secondary | ICD-10-CM | POA: Diagnosis not present

## 2016-07-12 DIAGNOSIS — D696 Thrombocytopenia, unspecified: Secondary | ICD-10-CM | POA: Diagnosis not present

## 2016-07-16 DIAGNOSIS — R918 Other nonspecific abnormal finding of lung field: Secondary | ICD-10-CM | POA: Diagnosis not present

## 2016-07-24 DIAGNOSIS — R918 Other nonspecific abnormal finding of lung field: Secondary | ICD-10-CM | POA: Diagnosis not present

## 2016-07-24 DIAGNOSIS — G4730 Sleep apnea, unspecified: Secondary | ICD-10-CM | POA: Diagnosis not present

## 2016-07-24 DIAGNOSIS — R938 Abnormal findings on diagnostic imaging of other specified body structures: Secondary | ICD-10-CM | POA: Diagnosis not present

## 2016-08-09 DIAGNOSIS — I350 Nonrheumatic aortic (valve) stenosis: Secondary | ICD-10-CM | POA: Insufficient documentation

## 2016-08-09 NOTE — Progress Notes (Signed)
Cardiology Office Note    Date:  08/10/2016   ID:  Jacob [name], DOB 05-20-1933, MRN [id number]  PCP:  Jacob Pac, MD  Cardiologist: Sinclair Grooms, MD   Chief Complaint  Patient presents with  . Coronary Artery Disease  . Leg Pain    History of Present Illness:  Jacob [name] is a 83 y.o. male presents for  CAD with triple-vessel CABG, hypertension, prior inferior infarct, hyperlipidemia, and aortic stenosis.  Leg pain in the form of paresthesias are present predominantly at night. Also has increased discomfort with walking. Denies angina. He denies dyspnea. There is no orthopnea or lower extremity swelling. He has not had palpitations or syncope.  Past Medical History:  Diagnosis Date  . Anxiety   . Arthritis    degenerative spine   . Cold    common cold, reports that he has a lingering cold   . Depression   . Diabetes mellitus without complication ([place])   . Headache(784.0)    sinus headaches   . Hyperlipidemia   . Hypertension    followed by Dr. Linard Millers   . Hypertension   . Myocardial infarction 2005  . Sleep apnea    CPAP almost q night   . Thrombocytopenia ([place])   . Vertigo     Past Surgical History:  Procedure Laterality Date  . CARDIAC CATHETERIZATION    . CORONARY ARTERY BYPASS GRAFT  2005   triple bypass  . EYE SURGERY     lasik procedure   . INGUINAL HERNIA REPAIR  07/15/2012   Procedure: LAPAROSCOPIC INGUINAL HERNIA;  Surgeon: [name], MD,FACS;  Location: [place];  Service: General;  Laterality: Right;  laparoscopic repair right inguinal hernia with mesh  . INSERTION OF MESH  07/15/2012   Procedure: INSERTION OF MESH;  Surgeon: [name], MD,FACS;  Location: [place];  Service: General;  Laterality: Right;  right inguinal area    Current Medications: Outpatient Medications Prior to Visit  Medication Sig Dispense Refill  . acetaminophen (TYLENOL) 650 MG CR tablet Take 500 mg by mouth every evening.     [name] alfuzosin (UROXATRAL) 10 MG 24  hr tablet Take 10 mg by mouth daily.     [name] aspirin 81 MG tablet Take 60 mg by mouth daily.     . Cetirizine HCl 10 MG CAPS Take 10 mg by mouth daily.    . hydrochlorothiazide (MICROZIDE) 12.5 MG capsule Take 12.5 mg by mouth daily.     [name] losartan (COZAAR) 50 MG tablet Take 1 tablet (50 mg total) by mouth at bedtime.    . metFORMIN (GLUCOPHAGE) 500 MG tablet Take 500 mg by mouth 2 (two) times daily with a meal.    . nitroGLYCERIN (NITROSTAT) 0.4 MG SL tablet Place 1 tablet (0.4 mg total) under the tongue daily as needed. For chest pain 30 tablet 1  . sertraline (ZOLOFT) 50 MG tablet Take 50 mg by mouth daily.    . simvastatin (ZOCOR) 40 MG tablet Take 40 mg by mouth at bedtime.      No facility-administered medications prior to visit.      Allergies:   Ivp dye [iodinated diagnostic agents]; Amlodipine besylate; and Lisinopril   Social History   Social History  . Marital status: Married    Spouse name: N/A  . Number of children: 2  . Years of education: 12   Occupational History  . retired    Social History Main Topics  . Smoking  status: Former Smoker    Quit date: 07/02/1988  . Smokeless tobacco: Former Systems developer    Quit date: 06/13/1984  . Alcohol use 1.2 oz/week    2 Glasses of wine per week     Comment: occasional- "once per week" - one drink  . Drug use: No  . Sexual activity: Not Asked   Other Topics Concern  . None   Social History Narrative   Patient is right handed.   Patient drinks 2 cups of coffee daily.   Lives at home with his wife.     Family History:  The patient's family history includes Cancer in his sister; Emphysema in his mother; Heart disease in his mother; Lung cancer in his brother and brother.   ROS:   Please see the history of present illness.    Rare palpitations. Uses C Pap for obstructive sleep apnea. Legs feel cold. Tremor is getting worse. Difficulty with balance. Some shortness of breath, back pain, muscle pain, and easy bruising.  All other  systems reviewed and are negative.   PHYSICAL EXAM:   VS:  BP (!) 144/88 (BP Location: Left Arm)   Pulse 89   Ht 6\' 3"  (1.905 m)   Wt 283 lb 12.8 oz (128.7 kg)   BMI 35.47 kg/m    GEN: Well nourished, well developed, in no acute distress  HEENT: normal  Neck: no JVD, carotid bruits, or masses Cardiac: RRR; no murmurs, rubs, or gallops,no edema . Decreased pulses in the feet bilaterally. Respiratory:  clear to auscultation bilaterally, normal work of breathing GI: soft, nontender, nondistended, + BS MS: no deformity or atrophy  Skin: warm and dry, no rash Neuro:  Alert and Oriented x 3, Strength and sensation are intact Psych: euthymic mood, full affect  Wt Readings from Last 3 Encounters:  08/10/16 283 lb 12.8 oz (128.7 kg)  07/29/15 282 lb 12.8 oz (128.3 kg)  03/30/15 280 lb (127 kg)      Studies/Labs Reviewed:   EKG:  EKG  Not repeated.  Recent Labs: No results found for requested labs within last 8760 hours.   Lipid Panel No results found for: CHOL, TRIG, HDL, CHOLHDL, VLDL, LDLCALC, LDLDIRECT  Additional studies/ records that were reviewed today include:  No new data    ASSESSMENT:    1. Coronary artery disease involving coronary bypass graft of native heart without angina pectoris   2. Essential hypertension   3. Other hyperlipidemia   4. Nonrheumatic aortic valve stenosis   5. Pain in both lower extremities      PLAN:  In order of problems listed above:  1. Stable. Encouraged aerobic activity. Advised to give [place] details angiography has any chest discomfort. 2. Weight loss and 2 g sodium diet. 3. LDL target is less than 70. 4. No significant murmur on exam. 5. Bilateral lower extremity Doppler study.  Follow-up with me in one year unless new symptoms.    Medication Adjustments/Labs and Tests Ordered: Current medicines are reviewed at length with the patient today.  Concerns regarding medicines are outlined above.  Medication changes, Labs and  Tests ordered today are listed in the Patient Instructions below. Patient Instructions  Medication Instructions:  None  Labwork: None  Testing/Procedures: Your physician has requested that you have a lower extremity arterial duplex. During this test, ultrasound is used to evaluate arterial blood flow in the legs. Allow one hour for this exam. There are no restrictions or special instructions.   Follow-Up: Your physician wants you  to follow-up in: 1 year with Dr. Tamala Julian. You will receive a reminder letter in the mail two months in advance. If you don't receive a letter, please call our office to schedule the follow-up appointment.   Any Other Special Instructions Will Be Listed Below (If Applicable).  Please follow up with your neurologist in regards to the numbness and tremors that are occurring.   If you need a refill on your cardiac medications before your next appointment, please call your pharmacy.      Signed, Sinclair Grooms, MD  08/10/2016 11:25 AM    [address], [place], [place]  [address] Phone: [phone]; Fax: [phone]

## 2016-08-10 ENCOUNTER — Ambulatory Visit (INDEPENDENT_AMBULATORY_CARE_PROVIDER_SITE_OTHER): Payer: Medicare Other | Admitting: Interventional Cardiology

## 2016-08-10 ENCOUNTER — Encounter: Payer: Self-pay | Admitting: Interventional Cardiology

## 2016-08-10 VITALS — BP 144/88 | HR 89 | Ht 75.0 in | Wt 283.8 lb

## 2016-08-10 DIAGNOSIS — I350 Nonrheumatic aortic (valve) stenosis: Secondary | ICD-10-CM

## 2016-08-10 DIAGNOSIS — M79605 Pain in left leg: Secondary | ICD-10-CM

## 2016-08-10 DIAGNOSIS — M79604 Pain in right leg: Secondary | ICD-10-CM | POA: Diagnosis not present

## 2016-08-10 DIAGNOSIS — I25810 Atherosclerosis of coronary artery bypass graft(s) without angina pectoris: Secondary | ICD-10-CM

## 2016-08-10 DIAGNOSIS — E7849 Other hyperlipidemia: Secondary | ICD-10-CM

## 2016-08-10 DIAGNOSIS — I10 Essential (primary) hypertension: Secondary | ICD-10-CM

## 2016-08-10 DIAGNOSIS — E784 Other hyperlipidemia: Secondary | ICD-10-CM | POA: Diagnosis not present

## 2016-08-10 NOTE — Patient Instructions (Addendum)
Medication Instructions:  None  Labwork: None  Testing/Procedures: Your physician has requested that you have a lower extremity arterial duplex. During this test, ultrasound is used to evaluate arterial blood flow in the legs. Allow one hour for this exam. There are no restrictions or special instructions.   Follow-Up: Your physician wants you to follow-up in: 1 year with Dr. Tamala Julian. You will receive a reminder letter in the mail two months in advance. If you don't receive a letter, please call our office to schedule the follow-up appointment.   Any Other Special Instructions Will Be Listed Below (If Applicable).  Please follow up with your neurologist in regards to the numbness and tremors that are occurring.   If you need a refill on your cardiac medications before your next appointment, please call your pharmacy.

## 2016-08-15 DIAGNOSIS — G4733 Obstructive sleep apnea (adult) (pediatric): Secondary | ICD-10-CM | POA: Diagnosis not present

## 2016-08-22 ENCOUNTER — Other Ambulatory Visit: Payer: Self-pay | Admitting: Interventional Cardiology

## 2016-08-22 DIAGNOSIS — M79604 Pain in right leg: Secondary | ICD-10-CM

## 2016-08-22 DIAGNOSIS — M79605 Pain in left leg: Principal | ICD-10-CM

## 2016-08-23 ENCOUNTER — Ambulatory Visit (HOSPITAL_COMMUNITY)
Admission: RE | Admit: 2016-08-23 | Discharge: 2016-08-23 | Disposition: A | Discharge status: Home / Self Care | Payer: Medicare Other | Source: Ambulatory Visit | Attending: Cardiovascular Disease | Admitting: Cardiovascular Disease

## 2016-08-23 DIAGNOSIS — M79604 Pain in right leg: Secondary | ICD-10-CM | POA: Diagnosis not present

## 2016-08-23 DIAGNOSIS — M79605 Pain in left leg: Secondary | ICD-10-CM | POA: Insufficient documentation

## 2016-09-05 DIAGNOSIS — M65331 Trigger finger, right middle finger: Secondary | ICD-10-CM | POA: Diagnosis not present

## 2016-10-04 DIAGNOSIS — M65331 Trigger finger, right middle finger: Secondary | ICD-10-CM | POA: Diagnosis not present

## 2016-12-04 DIAGNOSIS — Z85828 Personal history of other malignant neoplasm of skin: Secondary | ICD-10-CM | POA: Diagnosis not present

## 2016-12-04 DIAGNOSIS — Z08 Encounter for follow-up examination after completed treatment for malignant neoplasm: Secondary | ICD-10-CM | POA: Diagnosis not present

## 2016-12-04 DIAGNOSIS — L570 Actinic keratosis: Secondary | ICD-10-CM | POA: Diagnosis not present

## 2016-12-13 DIAGNOSIS — E119 Type 2 diabetes mellitus without complications: Secondary | ICD-10-CM | POA: Diagnosis not present

## 2016-12-13 DIAGNOSIS — R319 Hematuria, unspecified: Secondary | ICD-10-CM | POA: Diagnosis not present

## 2016-12-13 DIAGNOSIS — R251 Tremor, unspecified: Secondary | ICD-10-CM | POA: Diagnosis not present

## 2016-12-13 DIAGNOSIS — R252 Cramp and spasm: Secondary | ICD-10-CM | POA: Diagnosis not present

## 2016-12-13 DIAGNOSIS — I10 Essential (primary) hypertension: Secondary | ICD-10-CM | POA: Diagnosis not present

## 2017-01-10 DIAGNOSIS — F338 Other recurrent depressive disorders: Secondary | ICD-10-CM | POA: Diagnosis not present

## 2017-01-10 DIAGNOSIS — R918 Other nonspecific abnormal finding of lung field: Secondary | ICD-10-CM | POA: Diagnosis not present

## 2017-01-10 DIAGNOSIS — I350 Nonrheumatic aortic (valve) stenosis: Secondary | ICD-10-CM | POA: Diagnosis not present

## 2017-01-10 DIAGNOSIS — E669 Obesity, unspecified: Secondary | ICD-10-CM | POA: Diagnosis not present

## 2017-01-10 DIAGNOSIS — D696 Thrombocytopenia, unspecified: Secondary | ICD-10-CM | POA: Diagnosis not present

## 2017-01-10 DIAGNOSIS — Z0000 Encounter for general adult medical examination without abnormal findings: Secondary | ICD-10-CM | POA: Diagnosis not present

## 2017-01-10 DIAGNOSIS — E118 Type 2 diabetes mellitus with unspecified complications: Secondary | ICD-10-CM | POA: Diagnosis not present

## 2017-01-10 DIAGNOSIS — E782 Mixed hyperlipidemia: Secondary | ICD-10-CM | POA: Diagnosis not present

## 2017-01-10 DIAGNOSIS — Z6837 Body mass index (BMI) 37.0-37.9, adult: Secondary | ICD-10-CM | POA: Diagnosis not present

## 2017-01-10 DIAGNOSIS — I712 Thoracic aortic aneurysm, without rupture: Secondary | ICD-10-CM | POA: Diagnosis not present

## 2017-01-10 DIAGNOSIS — I10 Essential (primary) hypertension: Secondary | ICD-10-CM | POA: Diagnosis not present

## 2017-01-10 DIAGNOSIS — I25119 Atherosclerotic heart disease of native coronary artery with unspecified angina pectoris: Secondary | ICD-10-CM | POA: Diagnosis not present

## 2017-01-11 DIAGNOSIS — Z87891 Personal history of nicotine dependence: Secondary | ICD-10-CM | POA: Diagnosis not present

## 2017-01-11 DIAGNOSIS — M545 Low back pain: Secondary | ICD-10-CM | POA: Diagnosis not present

## 2017-01-11 DIAGNOSIS — R828 Abnormal findings on cytological and histological examination of urine: Secondary | ICD-10-CM | POA: Diagnosis not present

## 2017-01-11 DIAGNOSIS — R319 Hematuria, unspecified: Secondary | ICD-10-CM | POA: Diagnosis not present

## 2017-01-21 DIAGNOSIS — R310 Gross hematuria: Secondary | ICD-10-CM | POA: Diagnosis not present

## 2017-01-21 DIAGNOSIS — N200 Calculus of kidney: Secondary | ICD-10-CM | POA: Diagnosis not present

## 2017-01-21 DIAGNOSIS — I252 Old myocardial infarction: Secondary | ICD-10-CM | POA: Diagnosis not present

## 2017-01-21 DIAGNOSIS — Z87891 Personal history of nicotine dependence: Secondary | ICD-10-CM | POA: Diagnosis not present

## 2017-01-21 DIAGNOSIS — M545 Low back pain: Secondary | ICD-10-CM | POA: Diagnosis not present

## 2017-01-29 ENCOUNTER — Encounter: Payer: Self-pay | Admitting: Neurology

## 2017-01-29 ENCOUNTER — Ambulatory Visit (INDEPENDENT_AMBULATORY_CARE_PROVIDER_SITE_OTHER): Payer: Medicare Other | Admitting: Neurology

## 2017-01-29 VITALS — BP 154/84 | HR 69 | Ht 75.0 in | Wt 283.0 lb

## 2017-01-29 DIAGNOSIS — R269 Unspecified abnormalities of gait and mobility: Secondary | ICD-10-CM | POA: Diagnosis not present

## 2017-01-29 DIAGNOSIS — G63 Polyneuropathy in diseases classified elsewhere: Secondary | ICD-10-CM

## 2017-01-29 DIAGNOSIS — I25810 Atherosclerosis of coronary artery bypass graft(s) without angina pectoris: Secondary | ICD-10-CM

## 2017-01-29 DIAGNOSIS — G250 Essential tremor: Secondary | ICD-10-CM | POA: Diagnosis not present

## 2017-01-29 NOTE — Progress Notes (Signed)
Chief Complaint  Patient presents with  . Tremors    His tremors have worsened since last seen in 03/2015.  . Gait Problem    Reports that he is still unsteady when walking.  Says he tends to drag his feet and feels like he is falling forward. He has had several falls. Denies dizziness. He does try to stay active and swims three times per week.       PATIENT: Jacob [name] DOB: [birth year]  HISTORICAL  Jacob [name] is 83 yo RH male, alone at visit, he is referred by his primary care physician Hulan Fess for evaluation of bilateral hands tremor.Initial evaluation was on January 29 2017.  He has history of hypertension, hyperlipidemia, type 2 diabetes, coronary artery disease,  I saw him initially in 2016 for bilateral hands tremor, which she began to notice since 2014, he has difficulty using utensils, writing, right worse than left,  He denies gait difficulty, no loss sense of smell, no REM sleep disorder,  He still driving, active at home  Since 2016, he noticed mild unsteady gait, especially when he changes position suddenly, such as looking up, bending over, he has a tendency to fall,  He has central obesity, denies significant numbness at bilateral feet, long history of diabetes  Today he came in complains of mild memory loss, rely on his GPS while driving, sometimes has blank moments, got lost while driving,  We have personally reviewed MRI of the brain August 2016, mild generalized atrophy, supratentorium small vessel disease, no acute abnormality.  REVIEW OF SYSTEMS: Full 14 system review of systems performed and notable only for urination problems, impotence, feeling hot, cramps  ALLERGIES: Allergies  Allergen Reactions  . Ivp Dye [Iodinated Diagnostic Agents] Hives    Hives Hives   . Metrizamide Hives    Hives Hives  . Amlodipine Besylate Other (See Comments)    Dizziness  . Lisinopril Cough    Cough    HOME MEDICATIONS: Current Outpatient Prescriptions    Medication Sig Dispense Refill  . acetaminophen (TYLENOL) 650 MG CR tablet Take 500 mg by mouth every evening.     [name] alfuzosin (UROXATRAL) 10 MG 24 hr tablet Take 10 mg by mouth daily.     [name] aspirin 81 MG tablet Take 60 mg by mouth daily.     . carvedilol (COREG) 12.5 MG tablet Take 1 tablet (12.5 mg total) by mouth 2 (two) times daily with a meal.    . Cetirizine HCl 10 MG CAPS Take 10 mg by mouth daily.    . hydrochlorothiazide (MICROZIDE) 12.5 MG capsule Take 12.5 mg by mouth daily.     [name] losartan (COZAAR) 50 MG tablet Take 1 tablet (50 mg total) by mouth at bedtime.    . metFORMIN (GLUCOPHAGE) 500 MG tablet Take 500 mg by mouth 2 (two) times daily with a meal.    . NITROSTAT 0.4 MG SL tablet Place 0.4 mg under the tongue daily as needed. For chest pain    . sertraline (ZOLOFT) 50 MG tablet Take 50 mg by mouth daily.    . simvastatin (ZOCOR) 40 MG tablet Take 40 mg by mouth at bedtime.        PAST MEDICAL HISTORY: Past Medical History:  Diagnosis Date  . Anxiety   . Arthritis    degenerative spine   . Cold    common cold, reports that he has a lingering cold   . Depression   . Diabetes mellitus without complication (  [facility])   . Headache(784.0)    sinus headaches   . Hyperlipidemia   . Hypertension    followed by Dr. Linard Millers   . Hypertension   . Myocardial infarction ([place]) 2005  . Sleep apnea    CPAP almost q night   . Thrombocytopenia ([place])   . Vertigo     PAST SURGICAL HISTORY: Past Surgical History:  Procedure Laterality Date  . CARDIAC CATHETERIZATION    . CORONARY ARTERY BYPASS GRAFT  2005   triple bypass  . EYE SURGERY     lasik procedure   . INGUINAL HERNIA REPAIR  07/15/2012   Procedure: LAPAROSCOPIC INGUINAL HERNIA;  Surgeon: [name], MD,FACS;  Location: [place];  Service: General;  Laterality: Right;  laparoscopic repair right inguinal hernia with mesh  . INSERTION OF MESH  07/15/2012   Procedure: INSERTION OF MESH;  Surgeon: [name], MD,FACS;   Location: [place];  Service: General;  Laterality: Right;  right inguinal area    FAMILY HISTORY: Family History  Problem Relation Age of Onset  . Cancer Sister        lung and breast  . Lung cancer Brother   . Emphysema Mother   . Heart disease Mother   . Lung cancer Brother     SOCIAL HISTORY:  History   Social History  . Marital Status: Married    Spouse Name: N/A  . Number of Children: 2  . Years of Education: N/A   Occupational History  . Not on file.   Social History Main Topics  . Smoking status: Former Research scientist (life sciences)  . Smokeless tobacco: Former Systems developer    Quit date: 06/13/1984  . Alcohol Use: Yes     Comment: occasional- "once per week" - one drink  . Drug Use: No  . Sexual Activity: Not on file   Other Topics Concern  . Not on file   Social History Narrative     PHYSICAL EXAM   Vitals:   01/29/17 1314  BP: (!) 154/84  Pulse: 69  Weight: 283 lb (128.4 kg)  Height: 6\' 3"  (1.905 m)    Not recorded      Body mass index is 35.37 kg/m.  PHYSICAL EXAMNIATION:  Gen: NAD, conversant, well nourised, obese, well groomed                     Cardiovascular: Regular rate rhythm, no peripheral edema, warm, nontender. Eyes: Conjunctivae clear without exudates or hemorrhage Neck: Supple, no carotid bruise. Pulmonary: Clear to auscultation bilaterally   NEUROLOGICAL EXAM:  MENTAL STATUS: Speech:    Speech is normal; fluent and spontaneous with normal comprehension.  Cognition:    The patient is oriented to person, place, and time;     recent and remote memory intact;     language fluent;     normal attention, concentration,     fund of knowledge.  CRANIAL NERVES: CN II: Visual fields are full to confrontation. Pupil were equal round reactive to light  CN III, IV, VI: extraocular movement are normal. No ptosis. CN V: Facial sensation is intact to pinprick in all 3 divisions bilaterally. Corneal responses are intact.  CN VII: Face is symmetric with normal  eye closure and smile. CN VIII: Hearing is normal to rubbing fingers CN IX, X: Palate elevates symmetrically. Phonation is normal. CN XI: Head turning and shoulder shrug are intact CN XII: Tongue is midline with normal movements and no atrophy.  MOTOR:  Mild bilateral hands posturing tremor, slight bilateral finger abduction weakness.   REFLEXES: Reflexes are 3 and symmetric at the biceps, triceps, knees, and ankles. Plantar responses are flexor.  SENSORY:  length dependent decreased light touch, pinprick, position sense, and vibration sensation at toes.  COORDINATION: Rapid alternating movements and fine finger movements are intact. There is no dysmetria on finger-to-nose and heel-knee-shin.   GAIT/STANCE: Get up arm crossed, steady,, Romberg is absent.   DIAGNOSTIC DATA (LABS, IMAGING, TESTING) - I reviewed patient records, labs, notes, testing and imaging myself where available.  Lab Results  Component Value Date   WBC 7.9 07/14/2012   HGB 13.2 07/14/2012   HCT 39.0 07/14/2012   MCV 83.3 07/14/2012   PLT 130 (L) 07/14/2012      Component Value Date/Time   NA 141 07/14/2012 1520   K 4.2 07/14/2012 1520   CL 103 07/14/2012 1520   CO2 28 07/14/2012 1520   GLUCOSE 100 (H) 07/14/2012 1520   BUN 24 (H) 07/14/2012 1520   CREATININE 1.08 07/14/2012 1520   CALCIUM 9.9 07/14/2012 1520   PROT 5.9 (L) 02/27/2007 1859   ALBUMIN 3.0 (L) 02/27/2007 1859   AST 43 (H) 02/27/2007 1859   ALT 58 (H) 02/27/2007 1859   ALKPHOS 66 02/27/2007 1859   BILITOT 0.8 02/27/2007 1859   GFRNONAA 64 (L) 07/14/2012 1520   GFRAA 74 (L) 07/14/2012 1520   No results found for: CHOL, HDL, LDLCALC, LDLDIRECT, TRIG, CHOLHDL Lab Results  Component Value Date   HGBA1C (H) 02/28/2007    7.9 (NOTE)   The ADA recommends the following therapeutic goals for glycemic   control related to Hgb A1C measurement:   Goal of Therapy:   < 7.0% Hgb A1C   Action Suggested:  > 8.0% Hgb A1C   Ref:  Diabetes Care,  34, Suppl. 1, 1999   No results found for: VITAMINB12 No results found for: TSH    ASSESSMENT AND PLAN  Doral Ventrella is a 83 y.o. male  right-handed Caucasian male, with past medical history of hypertension, diabetes, mild diabetic peripheral neuropathy, coronary artery disease,   Essential tremor Worsening unsteady gait, urinary urgency, occasionally incontinence.  I have suggested MRI of the cervical spine, his symptoms overall has been stable, he wants to hold off further examination,  There is evidence of mild length dependent sensory changes on examinations, suggestive of mild peripheral neuropathy, likely related to his diabetes, likely has a component of orthostatic hypotension, and was also complicated by central obesity, deconditioning  I have discussed with patient, he wants to hold off further evaluation at this point, I have advised him continue well hydration, tight control of his diabetes,  Continue moderate exercise.  Marcial Pacas, M.D. Ph.D.  [facility] [address], [place], [place] [address] Ph: [phone] Fax: [phone]

## 2017-03-24 DIAGNOSIS — Z23 Encounter for immunization: Secondary | ICD-10-CM | POA: Diagnosis not present

## 2017-07-15 DIAGNOSIS — R828 Abnormal findings on cytological and histological examination of urine: Secondary | ICD-10-CM | POA: Diagnosis not present

## 2017-07-15 DIAGNOSIS — I252 Old myocardial infarction: Secondary | ICD-10-CM | POA: Diagnosis not present

## 2017-07-15 DIAGNOSIS — N281 Cyst of kidney, acquired: Secondary | ICD-10-CM | POA: Diagnosis not present

## 2017-07-15 DIAGNOSIS — N200 Calculus of kidney: Secondary | ICD-10-CM | POA: Diagnosis not present

## 2017-07-22 DIAGNOSIS — E118 Type 2 diabetes mellitus with unspecified complications: Secondary | ICD-10-CM | POA: Diagnosis not present

## 2017-07-22 DIAGNOSIS — Z7984 Long term (current) use of oral hypoglycemic drugs: Secondary | ICD-10-CM | POA: Diagnosis not present

## 2017-07-31 DIAGNOSIS — R0602 Shortness of breath: Secondary | ICD-10-CM | POA: Diagnosis not present

## 2017-07-31 DIAGNOSIS — E119 Type 2 diabetes mellitus without complications: Secondary | ICD-10-CM | POA: Diagnosis not present

## 2017-07-31 DIAGNOSIS — I10 Essential (primary) hypertension: Secondary | ICD-10-CM | POA: Diagnosis not present

## 2017-07-31 DIAGNOSIS — R918 Other nonspecific abnormal finding of lung field: Secondary | ICD-10-CM | POA: Diagnosis not present

## 2017-07-31 DIAGNOSIS — G4730 Sleep apnea, unspecified: Secondary | ICD-10-CM | POA: Diagnosis not present

## 2017-08-07 DIAGNOSIS — R918 Other nonspecific abnormal finding of lung field: Secondary | ICD-10-CM | POA: Diagnosis not present

## 2017-08-14 DIAGNOSIS — G4733 Obstructive sleep apnea (adult) (pediatric): Secondary | ICD-10-CM | POA: Diagnosis not present

## 2017-08-15 DIAGNOSIS — R918 Other nonspecific abnormal finding of lung field: Secondary | ICD-10-CM | POA: Diagnosis not present

## 2017-08-26 DIAGNOSIS — D1801 Hemangioma of skin and subcutaneous tissue: Secondary | ICD-10-CM | POA: Diagnosis not present

## 2017-08-26 DIAGNOSIS — X32XXXS Exposure to sunlight, sequela: Secondary | ICD-10-CM | POA: Diagnosis not present

## 2017-08-26 DIAGNOSIS — L814 Other melanin hyperpigmentation: Secondary | ICD-10-CM | POA: Diagnosis not present

## 2017-08-26 DIAGNOSIS — L821 Other seborrheic keratosis: Secondary | ICD-10-CM | POA: Diagnosis not present

## 2017-08-26 DIAGNOSIS — L570 Actinic keratosis: Secondary | ICD-10-CM | POA: Diagnosis not present

## 2017-09-01 NOTE — Progress Notes (Signed)
Cardiology Office Note    Date:  09/02/2017   ID:  [name], DOB May 27, 1933, MRN [id number]  PCP:  Hulan Fess, MD  Cardiologist: Sinclair Grooms, MD   Chief Complaint  Patient presents with  . Coronary Artery Disease  . Follow-up    History of Present Illness:  Jacob [name] is a 84 y.o. male presents for  CAD with triple-vessel CABG, hypertension, prior inferior infarct, hyperlipidemia, and aortic stenosis.   Overall, he is doing relatively well but limited by fatigue, exertional dyspnea, and balance.  States that he if he squats or bends he will lose balance.  He is going back and doing flat walking.  He has not had angina.  He has not had syncope.  He denies orthopnea or lower extremity edema.  He denies chest pain.   Past Medical History:  Diagnosis Date  . Anxiety   . Arthritis    degenerative spine   . Cold    common cold, reports that he has a lingering cold   . Depression   . Diabetes mellitus without complication ([place])   . Headache(784.0)    sinus headaches   . Hyperlipidemia   . Hypertension    followed by Dr. Linard Millers   . Hypertension   . Myocardial infarction ([place]) 2005  . Sleep apnea    CPAP almost q night   . Thrombocytopenia ([place])   . Vertigo     Past Surgical History:  Procedure Laterality Date  . CARDIAC CATHETERIZATION    . CORONARY ARTERY BYPASS GRAFT  2005   triple bypass  . EYE SURGERY     lasik procedure   . INGUINAL HERNIA REPAIR  07/15/2012   Procedure: LAPAROSCOPIC INGUINAL HERNIA;  Surgeon: [name], MD,FACS;  Location: [facility];  Service: General;  Laterality: Right;  laparoscopic repair right inguinal hernia with mesh  . INSERTION OF MESH  07/15/2012   Procedure: INSERTION OF MESH;  Surgeon: [name], MD,FACS;  Location: [facility];  Service: General;  Laterality: Right;  right inguinal area    Current Medications: Outpatient Medications Prior to Visit  Medication Sig Dispense Refill  . acetaminophen (TYLENOL) 500 MG  tablet Take 500 mg by mouth at bedtime.    [name] alfuzosin (UROXATRAL) 10 MG 24 hr tablet Take 10 mg by mouth daily.     [name] aspirin 81 MG tablet Take 60 mg by mouth daily.     . Cetirizine HCl 10 MG CAPS Take 10 mg by mouth daily.    . Cholecalciferol (VITAMIN D PO) Take by mouth daily.    . Cyanocobalamin (VITAMIN B-12 PO) Take 1 tablet by mouth daily.    . hydrochlorothiazide (MICROZIDE) 12.5 MG capsule Take 12.5 mg by mouth daily.     [name] losartan (COZAAR) 50 MG tablet Take 1 tablet (50 mg total) by mouth at bedtime.    . metFORMIN (GLUCOPHAGE) 500 MG tablet Take 500 mg by mouth 2 (two) times daily with a meal.    . Multiple Vitamins-Minerals (MULTIVITAMIN PO) Take 1 tablet by mouth daily.     . nitroGLYCERIN (NITROSTAT) 0.4 MG SL tablet Place 1 tablet (0.4 mg total) under the tongue daily as needed. For chest pain 30 tablet 1  . pravastatin (PRAVACHOL) 10 MG tablet Take 10 mg by mouth daily.    . sertraline (ZOLOFT) 50 MG tablet Take 50 mg by mouth daily.    [name] acetaminophen (TYLENOL) 650 MG CR tablet Take 500 mg  by mouth every evening.     . simvastatin (ZOCOR) 40 MG tablet Take 40 mg by mouth at bedtime.      No facility-administered medications prior to visit.      Allergies:   Ivp dye [iodinated diagnostic agents]; Metrizamide; Amlodipine besylate; and Lisinopril   Social History   Socioeconomic History  . Marital status: Married    Spouse name: None  . Number of children: 2  . Years of education: 15  . Highest education level: None  Social Needs  . Financial resource strain: None  . Food insecurity - worry: None  . Food insecurity - inability: None  . Transportation needs - medical: None  . Transportation needs - non-medical: None  Occupational History  . Occupation: retired  Tobacco Use  . Smoking status: Former Smoker    Last attempt to quit: 07/02/1988    Years since quitting: 29.1  . Smokeless tobacco: Former Systems developer    Quit date: 06/13/1984  Substance and Sexual Activity    . Alcohol use: Yes    Alcohol/week: 1.2 oz    Types: 2 Glasses of wine per week    Comment: occasional- "once per week" - one drink  . Drug use: No  . Sexual activity: None  Other Topics Concern  . None  Social History Narrative   Patient is right handed.   Patient drinks 2 cups of coffee daily.   Lives at home with his wife.     Family History:  The patient's family history includes Cancer in his sister; Emphysema in his mother; Heart disease in his mother; Lung cancer in his brother and brother.   ROS:   Please see the history of present illness.    Decreased memory, cough, difficulty with balance, exertional fatigue, exertional dyspnea, back pain, and muscle pain. All other systems reviewed and are negative.   PHYSICAL EXAM:   VS:  BP 132/84   Pulse (!) 46   Ht 6\' 4"  (1.93 m)   Wt 288 lb 1.9 oz (130.7 kg)   BMI 35.07 kg/m    GEN: Well nourished, well developed, in no acute distress  HEENT: normal  Neck: no JVD, carotid bruits, or masses Cardiac: Irregular RR; 2/6 systolic murmur consistent with aortic stenosis, no rubs, or gallops,no edema. Respiratory:  clear to auscultation bilaterally, normal work of breathing GI: soft, nontender, nondistended, + BS MS: no deformity or atrophy  Skin: warm and dry, no rash Neuro:  Alert and Oriented x 3, Strength and sensation are intact Psych: euthymic mood, full affect  Wt Readings from Last 3 Encounters:  09/02/17 288 lb 1.9 oz (130.7 kg)  01/29/17 283 lb (128.4 kg)  08/10/16 283 lb 12.8 oz (128.7 kg)      Studies/Labs Reviewed:   EKG:  EKG sinus bradycardia, old inferior infarct, second-degree AV block.  Second-degree block is Mobitz type I/Wenckebach  Recent Labs: No results found for requested labs within last 8760 hours.   Lipid Panel No results found for: CHOL, TRIG, HDL, CHOLHDL, VLDL, LDLCALC, LDLDIRECT  Additional studies/ records that were reviewed today include:  ECHOCARDIOGRAM 08/10/2015: Study  Conclusions   - Left ventricle: Severe basal septla hypertrophy. The cavity size   was normal. Systolic function was normal. The estimated ejection   fraction was in the range of 55% to 60%. - Aortic valve: There was mild stenosis. There was trivial   regurgitation. - Mitral valve: Calcified annulus. Mildly thickened leaflets .   There was mild regurgitation. -  Left atrium: The atrium was moderately to severely dilated. - Right atrium: The atrium was moderately dilated. - Atrial septum: redundant and mobile with likely aneurysm cannot   r/o PFO.      ASSESSMENT:    1. Coronary artery disease of bypass graft of native heart with stable angina pectoris ([place])   2. Mobitz type 1 second degree atrioventricular block   3. Nonrheumatic aortic valve stenosis   4. Essential hypertension   5. Renal insufficiency      PLAN:  In order of problems listed above:  1. No symptoms to suggest angina a year. 2. Previously evaluated with 48-hour Holter.  Second-degree heart block (Mobitz 1) was noted at that time.  Plan to continue monitoring but exclude chronotropic incompetence with a low level exercise treadmill test to determine if he can increase heart rate greater than 115 bpm. 3. Exam is not compatible with critical aortic stenosis.  Less than 2 years ago left ear was mild. 4. Blood pressure control is adequate.  Beta-blocker therapy has been discontinued.  Requires losartan and HCTZ for control.  Clinical follow-up in 1 year unless he has chronotropic incompetence, in which case he will be referred to EP for consideration of pacemaker therapy.  On return he will need to have an echocardiogram done to follow-up aortic stenosis.    Medication Adjustments/Labs and Tests Ordered: Current medicines are reviewed at length with the patient today.  Concerns regarding medicines are outlined above.  Medication changes, Labs and Tests ordered today are listed in the Patient Instructions  below. Patient Instructions  Medication Instructions:  Your physician recommends that you continue on your current medications as directed. Please refer to the Current Medication list given to you today.  Labwork: None  Testing/Procedures: Your physician has requested that you have an exercise tolerance test. For further information please visit [email or web address]. Please also follow instruction sheet, as given.   Follow-Up: Your physician wants you to follow-up in: 1 year with Dr. Tamala Julian.  You will receive a reminder letter in the mail two months in advance. If you don't receive a letter, please call our office to schedule the follow-up appointment.   Any Other Special Instructions Will Be Listed Below (If Applicable).     If you need a refill on your cardiac medications before your next appointment, please call your pharmacy.      Signed, Sinclair Grooms, MD  09/02/2017 4:41 PM    [facility] [address], [place], [place]  [address] Phone: [phone]; Fax: [phone]

## 2017-09-02 ENCOUNTER — Ambulatory Visit (INDEPENDENT_AMBULATORY_CARE_PROVIDER_SITE_OTHER): Payer: Medicare Other | Admitting: Interventional Cardiology

## 2017-09-02 ENCOUNTER — Encounter: Payer: Self-pay | Admitting: Interventional Cardiology

## 2017-09-02 VITALS — BP 132/84 | HR 46 | Ht 76.0 in | Wt 288.1 lb

## 2017-09-02 DIAGNOSIS — N289 Disorder of kidney and ureter, unspecified: Secondary | ICD-10-CM | POA: Diagnosis not present

## 2017-09-02 DIAGNOSIS — I350 Nonrheumatic aortic (valve) stenosis: Secondary | ICD-10-CM | POA: Diagnosis not present

## 2017-09-02 DIAGNOSIS — I10 Essential (primary) hypertension: Secondary | ICD-10-CM | POA: Diagnosis not present

## 2017-09-02 DIAGNOSIS — I441 Atrioventricular block, second degree: Secondary | ICD-10-CM | POA: Diagnosis not present

## 2017-09-02 DIAGNOSIS — I25708 Atherosclerosis of coronary artery bypass graft(s), unspecified, with other forms of angina pectoris: Secondary | ICD-10-CM | POA: Diagnosis not present

## 2017-09-02 NOTE — Patient Instructions (Signed)
Medication Instructions:  Your physician recommends that you continue on your current medications as directed. Please refer to the Current Medication list given to you today.  Labwork: None  Testing/Procedures: Your physician has requested that you have an exercise tolerance test. For further information please visit [email or web address]. Please also follow instruction sheet, as given.   Follow-Up: Your physician wants you to follow-up in: 1 year with Dr. Tamala Julian.  You will receive a reminder letter in the mail two months in advance. If you don't receive a letter, please call our office to schedule the follow-up appointment.   Any Other Special Instructions Will Be Listed Below (If Applicable).     If you need a refill on your cardiac medications before your next appointment, please call your pharmacy.

## 2017-09-12 ENCOUNTER — Ambulatory Visit (INDEPENDENT_AMBULATORY_CARE_PROVIDER_SITE_OTHER): Payer: Medicare Other

## 2017-09-12 DIAGNOSIS — I441 Atrioventricular block, second degree: Secondary | ICD-10-CM

## 2017-09-12 LAB — EXERCISE TOLERANCE TEST
CSEPED: 6 min
CSEPEW: 3.7 METS
CSEPHR: 91 %
Exercise duration (sec): 30 s
MPHR: 137 {beats}/min
Peak HR: 125 {beats}/min
RPE: 14
Rest HR: 50 {beats}/min

## 2017-10-28 DIAGNOSIS — E7800 Pure hypercholesterolemia, unspecified: Secondary | ICD-10-CM | POA: Diagnosis not present

## 2017-10-28 DIAGNOSIS — E118 Type 2 diabetes mellitus with unspecified complications: Secondary | ICD-10-CM | POA: Diagnosis not present

## 2017-10-28 DIAGNOSIS — Z79899 Other long term (current) drug therapy: Secondary | ICD-10-CM | POA: Diagnosis not present

## 2017-10-28 DIAGNOSIS — E1165 Type 2 diabetes mellitus with hyperglycemia: Secondary | ICD-10-CM | POA: Diagnosis not present

## 2017-10-28 DIAGNOSIS — Z7984 Long term (current) use of oral hypoglycemic drugs: Secondary | ICD-10-CM | POA: Diagnosis not present

## 2017-12-25 DIAGNOSIS — I10 Essential (primary) hypertension: Secondary | ICD-10-CM | POA: Diagnosis not present

## 2017-12-25 DIAGNOSIS — I2510 Atherosclerotic heart disease of native coronary artery without angina pectoris: Secondary | ICD-10-CM | POA: Diagnosis not present

## 2017-12-25 DIAGNOSIS — R404 Transient alteration of awareness: Secondary | ICD-10-CM | POA: Diagnosis not present

## 2017-12-25 DIAGNOSIS — Z7984 Long term (current) use of oral hypoglycemic drugs: Secondary | ICD-10-CM | POA: Diagnosis not present

## 2017-12-25 DIAGNOSIS — W19XXXA Unspecified fall, initial encounter: Secondary | ICD-10-CM | POA: Diagnosis not present

## 2017-12-25 DIAGNOSIS — E119 Type 2 diabetes mellitus without complications: Secondary | ICD-10-CM | POA: Diagnosis not present

## 2017-12-25 DIAGNOSIS — Z87891 Personal history of nicotine dependence: Secondary | ICD-10-CM | POA: Diagnosis not present

## 2017-12-25 DIAGNOSIS — G4730 Sleep apnea, unspecified: Secondary | ICD-10-CM | POA: Diagnosis not present

## 2017-12-25 DIAGNOSIS — F419 Anxiety disorder, unspecified: Secondary | ICD-10-CM | POA: Diagnosis not present

## 2017-12-25 DIAGNOSIS — R61 Generalized hyperhidrosis: Secondary | ICD-10-CM | POA: Diagnosis not present

## 2017-12-25 DIAGNOSIS — Z7982 Long term (current) use of aspirin: Secondary | ICD-10-CM | POA: Diagnosis not present

## 2017-12-25 DIAGNOSIS — I469 Cardiac arrest, cause unspecified: Secondary | ICD-10-CM | POA: Diagnosis not present

## 2017-12-25 DIAGNOSIS — Z79899 Other long term (current) drug therapy: Secondary | ICD-10-CM | POA: Diagnosis not present

## 2017-12-25 DIAGNOSIS — I959 Hypotension, unspecified: Secondary | ICD-10-CM | POA: Diagnosis not present

## 2017-12-30 DEATH — deceased

## 2018-03-02 DEATH — deceased
